# Patient Record
Sex: Female | Born: 1935 | Race: White | Hispanic: No | Smoking: Never smoker
Health system: Southern US, Community
[De-identification: ages and names within clinical notes are randomized; demographics above are authoritative.]

## PROBLEM LIST (undated history)

## (undated) DIAGNOSIS — I10 Essential (primary) hypertension: Secondary | ICD-10-CM

## (undated) DIAGNOSIS — G459 Transient cerebral ischemic attack, unspecified: Secondary | ICD-10-CM

## (undated) DIAGNOSIS — I272 Pulmonary hypertension, unspecified: Secondary | ICD-10-CM

## (undated) DIAGNOSIS — F32A Depression, unspecified: Secondary | ICD-10-CM

## (undated) DIAGNOSIS — G9341 Metabolic encephalopathy: Secondary | ICD-10-CM

## (undated) DIAGNOSIS — F0390 Unspecified dementia without behavioral disturbance: Secondary | ICD-10-CM

## (undated) DIAGNOSIS — K219 Gastro-esophageal reflux disease without esophagitis: Secondary | ICD-10-CM

## (undated) DIAGNOSIS — F329 Major depressive disorder, single episode, unspecified: Secondary | ICD-10-CM

## (undated) DIAGNOSIS — D649 Anemia, unspecified: Secondary | ICD-10-CM

## (undated) HISTORY — DX: Anemia, unspecified: D64.9

## (undated) HISTORY — DX: Transient cerebral ischemic attack, unspecified: G45.9

## (undated) HISTORY — DX: Pulmonary hypertension, unspecified: I27.20

## (undated) HISTORY — PX: ABDOMINAL HYSTERECTOMY: SHX81

## (undated) HISTORY — DX: Gastro-esophageal reflux disease without esophagitis: K21.9

## (undated) HISTORY — DX: Depression, unspecified: F32.A

## (undated) HISTORY — DX: Essential (primary) hypertension: I10

## (undated) HISTORY — DX: Major depressive disorder, single episode, unspecified: F32.9

---

## 1993-10-01 HISTORY — PX: OTHER SURGICAL HISTORY: SHX169

## 1997-10-01 HISTORY — PX: FRACTURE SURGERY: SHX138

## 2005-01-08 ENCOUNTER — Ambulatory Visit: Payer: Self-pay | Admitting: General Surgery

## 2006-01-27 ENCOUNTER — Emergency Department: Payer: Self-pay | Admitting: Emergency Medicine

## 2006-02-07 ENCOUNTER — Emergency Department: Payer: Self-pay | Admitting: Emergency Medicine

## 2008-04-18 ENCOUNTER — Inpatient Hospital Stay: Payer: Self-pay | Admitting: *Deleted

## 2008-04-19 ENCOUNTER — Other Ambulatory Visit: Payer: Self-pay

## 2008-04-27 ENCOUNTER — Ambulatory Visit: Payer: Self-pay | Admitting: Internal Medicine

## 2008-05-26 DIAGNOSIS — M810 Age-related osteoporosis without current pathological fracture: Secondary | ICD-10-CM | POA: Insufficient documentation

## 2008-05-26 DIAGNOSIS — Z8679 Personal history of other diseases of the circulatory system: Secondary | ICD-10-CM | POA: Insufficient documentation

## 2008-05-26 DIAGNOSIS — D649 Anemia, unspecified: Secondary | ICD-10-CM

## 2008-05-26 DIAGNOSIS — I10 Essential (primary) hypertension: Secondary | ICD-10-CM | POA: Insufficient documentation

## 2008-05-26 DIAGNOSIS — K219 Gastro-esophageal reflux disease without esophagitis: Secondary | ICD-10-CM

## 2008-05-26 DIAGNOSIS — R011 Cardiac murmur, unspecified: Secondary | ICD-10-CM

## 2008-06-01 HISTORY — PX: CHOLECYSTECTOMY: SHX55

## 2008-06-24 ENCOUNTER — Encounter: Payer: Self-pay | Admitting: Internal Medicine

## 2008-06-24 ENCOUNTER — Ambulatory Visit: Payer: Self-pay | Admitting: Internal Medicine

## 2008-06-24 DIAGNOSIS — R32 Unspecified urinary incontinence: Secondary | ICD-10-CM

## 2008-07-29 ENCOUNTER — Encounter: Payer: Self-pay | Admitting: Internal Medicine

## 2008-07-29 ENCOUNTER — Ambulatory Visit: Payer: Self-pay | Admitting: Internal Medicine

## 2008-07-29 DIAGNOSIS — R351 Nocturia: Secondary | ICD-10-CM | POA: Insufficient documentation

## 2008-09-15 ENCOUNTER — Telehealth: Payer: Self-pay | Admitting: Internal Medicine

## 2008-09-16 ENCOUNTER — Encounter: Payer: Self-pay | Admitting: Internal Medicine

## 2008-10-22 ENCOUNTER — Encounter: Payer: Self-pay | Admitting: Internal Medicine

## 2008-10-22 ENCOUNTER — Ambulatory Visit: Payer: Self-pay | Admitting: Internal Medicine

## 2008-12-16 ENCOUNTER — Encounter: Payer: Self-pay | Admitting: Internal Medicine

## 2008-12-16 ENCOUNTER — Ambulatory Visit: Payer: Self-pay | Admitting: Internal Medicine

## 2009-01-13 ENCOUNTER — Ambulatory Visit: Payer: Self-pay | Admitting: Internal Medicine

## 2009-01-13 ENCOUNTER — Encounter: Payer: Self-pay | Admitting: Internal Medicine

## 2009-03-17 ENCOUNTER — Encounter: Payer: Self-pay | Admitting: Internal Medicine

## 2009-03-17 ENCOUNTER — Ambulatory Visit: Payer: Self-pay | Admitting: Internal Medicine

## 2009-05-24 ENCOUNTER — Encounter: Payer: Self-pay | Admitting: Internal Medicine

## 2009-06-09 ENCOUNTER — Encounter: Payer: Self-pay | Admitting: Internal Medicine

## 2009-06-09 ENCOUNTER — Ambulatory Visit: Payer: Self-pay | Admitting: Internal Medicine

## 2009-06-09 DIAGNOSIS — G459 Transient cerebral ischemic attack, unspecified: Secondary | ICD-10-CM | POA: Insufficient documentation

## 2009-09-06 ENCOUNTER — Ambulatory Visit: Payer: Self-pay | Admitting: Internal Medicine

## 2009-09-06 ENCOUNTER — Encounter: Payer: Self-pay | Admitting: Internal Medicine

## 2009-10-18 ENCOUNTER — Telehealth (INDEPENDENT_AMBULATORY_CARE_PROVIDER_SITE_OTHER): Payer: Self-pay | Admitting: *Deleted

## 2009-10-19 ENCOUNTER — Encounter: Payer: Self-pay | Admitting: Internal Medicine

## 2009-11-22 ENCOUNTER — Encounter: Payer: Self-pay | Admitting: Internal Medicine

## 2010-01-19 ENCOUNTER — Ambulatory Visit: Payer: Self-pay | Admitting: Internal Medicine

## 2010-01-19 ENCOUNTER — Encounter: Payer: Self-pay | Admitting: Internal Medicine

## 2010-01-19 DIAGNOSIS — I079 Rheumatic tricuspid valve disease, unspecified: Secondary | ICD-10-CM | POA: Insufficient documentation

## 2010-04-27 ENCOUNTER — Encounter: Payer: Self-pay | Admitting: Internal Medicine

## 2010-04-27 ENCOUNTER — Ambulatory Visit: Payer: Self-pay | Admitting: Internal Medicine

## 2010-04-28 ENCOUNTER — Encounter: Payer: Self-pay | Admitting: Internal Medicine

## 2010-05-22 ENCOUNTER — Encounter: Payer: Self-pay | Admitting: Internal Medicine

## 2010-06-22 ENCOUNTER — Encounter: Payer: Self-pay | Admitting: Internal Medicine

## 2010-06-22 ENCOUNTER — Ambulatory Visit: Payer: Self-pay | Admitting: Internal Medicine

## 2010-06-22 DIAGNOSIS — F329 Major depressive disorder, single episode, unspecified: Secondary | ICD-10-CM

## 2010-08-01 ENCOUNTER — Encounter: Payer: Self-pay | Admitting: Internal Medicine

## 2010-09-14 ENCOUNTER — Encounter: Payer: Self-pay | Admitting: Internal Medicine

## 2010-10-31 NOTE — Assessment & Plan Note (Signed)
Summary: [facility]   Vital Signs:  Patient profile:   77 year old female Weight:      112 pounds Temp:     97.3 degrees F Pulse rate:   60 / minute Resp:     18 per minute BP sitting:   120 / 66 CC: Assisted living follow up   CC:  Assisted living follow up.  History of Present Illness: doing reasonably well Reviewed status with [name] , RN---no new concerns  Recent check up with [facility] cardiology No changes needed NO chest pain No SOB Notes "heart trouble" since her esophagus surgery  No dysphagia Heartburn not an active issue now  Slight cough of late Not sick with fever though Dry cough  Troubled by urinary frequency--goes every 2 hours intermittent incontinence EVery 2 hour nocturia as well  Does have feelings of being down at times Misses independence--not driving, stuck in 1 place Able to move on without major issues though  Allergies: 1)  ! * Carafate 2)  ! * Tramadol  Past History:  Past medical, surgical, family and social histories (including risk factors) reviewed, and no changes noted (except as noted below).  Past Medical History: GERD Hypertension Osteoporosis Transient ischemic attack, hx of Anemia-NOS Urinary incontinence Pulmonary HTN/TR  Past Surgical History: Reviewed history from 05/26/2008 and no changes required. Cholecystectomy- (06/2008) Hysterectomy- has one ovary Perforated esophagus during dilation (1995) Hip fracture (1999)  Family History: Reviewed history from 05/26/2008 and no changes required. Father: died in his 65's-  CAD Mother: died age 47- brain tumor Siblings: only child  Social History: Reviewed history from 06/24/2008 and no changes required. 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  Occ when husband was living  Review of Systems       sleeps reasonably well---occ takes 1 aleve----keeps her from "dreams" No sig  arthritis pain Still gets injections in right eye for macular degeneration appetite is fine weight is down 4# though   Physical Exam  General:  alert and healthy-appearing.   Neck:  supple, no masses, no thyromegaly, no carotid bruits, and no cervical lymphadenopathy.   Lungs:  normal respiratory effort and normal breath sounds.   Heart:  normal rate, regular rhythm, and no gallop.   soft systolic murmur along LLSB Abdomen:  soft and non-tender.   Msk:  no joint tenderness and no joint swelling.   Extremities:  no edema or calf tenderness Skin:  no suspicious lesions and no ulcerations.   Psych:  normally interactive, good eye contact, not anxious appearing, and not depressed appearing.     Impression & Recommendations:  Problem # 1:  HYPERTENSION (ICD-401.9) Assessment Unchanged good control on current meds  Her updated medication list for this problem includes:    Altace 5 Mg Tabs (Ramipril) .Marland Kitchen... Take one by mouth daily    Atenolol 50 Mg Tabs (Atenolol) .Marland Kitchen... Take one by mouth daily    Norvasc 10 Mg Tabs (Amlodipine besylate) .Marland Kitchen... Take one by mouth daily  BP today: 120/66 Prior BP: 118/62 (09/06/2009)  Problem # 2:  GERD (ICD-530.81) Assessment: Unchanged no recent dysphagia heartburn controlled needs PPI forever  Her updated medication list for this problem includes:    Omeprazole 20 Mg Tbec (Omeprazole) .Marland Kitchen... Take one by mouth daily  Problem # 3:  OSTEOPOROSIS (ICD-733.00) Assessment: Unchanged no falls  ambulates with rollator walker on vitamin D  Her updated medication list for this problem includes:  Vitamin D 98119 Unit Caps (Ergocalciferol) .Marland Kitchen... 1 monthly  Problem # 4:  URINARY INCONTINENCE (ICD-788.30) Assessment: Unchanged deals with the inconvenience Neither of [place] excited about trying meds  Problem # 5:  TRICUSPID REGURGITATION (ICD-397.0) Assessment: Unchanged stable status follows at [facility]  Her updated medication list for this problem  includes:    Atenolol 50 Mg Tabs (Atenolol) .Marland Kitchen... Take one by mouth daily    Aspirin Ec 81 Mg Tbec (Aspirin) .Marland Kitchen... 1 daily  Complete Medication List: 1)  Altace 5 Mg Tabs (Ramipril) .... Take one by mouth daily 2)  Atenolol 50 Mg Tabs (Atenolol) .... Take one by mouth daily 3)  Norvasc 10 Mg Tabs (Amlodipine besylate) .... Take one by mouth daily 4)  Omeprazole 20 Mg Tbec (Omeprazole) .... Take one by mouth daily 5)  Xalatan 0.005 % Soln (Latanoprost) .... Use one drop each eye at bedtime 6)  Vitamin D 14782 Unit Caps (Ergocalciferol) .Marland Kitchen.. 1 monthly 7)  Ventolin Hfa 108 (90 Base) Mcg/act Aers (Albuterol sulfate) .... 2 puffs three times a day as needed for wheezing. use with spacer 8)  Aspirin Ec 81 Mg Tbec (Aspirin) .Marland Kitchen.. 1 daily  Patient Instructions: 1)  Will plan follow up in 2-3 months

## 2010-10-31 NOTE — Assessment & Plan Note (Signed)
Summary: [facility]   Vital Signs:  Patient profile:   77 year old female Weight:      109 pounds Temp:     97.4 degrees F Pulse rate:   64 / minute Resp:     18 per minute BP sitting:   124 / 64  History of Present Illness: Had still been taking desipramine  despite my stopping it discussed this she doesn't note any help anyway--will have her stop  Family , or friends, are concerned about her mood Staff notes occ depressed mood She endorses this but states "I am just getting old. When you have to stay in the same building all the time" Frustrations with laundry issues Does have some anhedonia  Has to eat slowly but eats okay sleeps okay but not great Has some thought about dying at times--thinks about her financial issues-- "to settle up everything"  No chest pain No SOB Mild edema--generally controlled with support hose    Allergies: 1)  ! * Carafate 2)  ! * Tramadol  Past History:  Past medical, surgical, family and social histories (including risk factors) reviewed for relevance to current acute and chronic problems.  Past Medical History: GERD Hypertension Osteoporosis Transient ischemic attack, hx of Anemia-NOS Urinary incontinence Pulmonary HTN/TR Depression  Past Surgical History: Reviewed history from 05/26/2008 and no changes required. Cholecystectomy- (06/2008) Hysterectomy- has one ovary Perforated esophagus during dilation (1995) Hip fracture (1999)  Family History: Reviewed history from 05/26/2008 and no changes required. Father: died in his 50's-  CAD Mother: died age 28- brain tumor Siblings: only child  Social History: Reviewed history from 06/24/2008 and no changes required. 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  Occ when husband was living  Review of Systems       Weight is down 3# since last visit bowels are okay--occ needs med for   constipation No urinary problems during the day--does have frequency but can manage--just the problems at night  Physical Exam  General:  alert.  NAD Neck:  supple, no masses, no thyromegaly, no carotid bruits, and no cervical lymphadenopathy.   Lungs:  normal respiratory effort, no intercostal retractions, no accessory muscle use, and normal breath sounds.   Heart:  normal rate, regular rhythm, and no gallop.   Occ skip beats soft systolic murmur Abdomen:  soft and non-tender.   Msk:  no joint tenderness and no joint swelling.   Extremities:  trace edema Skin:  multiple benign keratoses Psych:  normally interactive, good eye contact, not anxious appearing, dysphoric affect, and subdued.     Impression & Recommendations:  Problem # 1:  DEPRESSION (ICD-311) Assessment New  clearly seems to have depressed mood and decreased interest in activities, etc seems to have mild sleep and appetite issues as well will try low dose mirtazapine  Her updated medication list for this problem includes:    Mirtazapine 7.5 Mg Tabs (Mirtazapine) .Marland Kitchen... 1 tab by mouth at bedtime  Problem # 2:  HYPERTENSION (ICD-401.9) Assessment: Unchanged BP well controlled will  cut back on beta blocker in case that could be affecting mood  Her updated medication list for this problem includes:    Altace 5 Mg Tabs (Ramipril) .Marland Kitchen... Take one by mouth daily    Atenolol 50 Mg Tabs (Atenolol) .Marland Kitchen... Take 1/2 tab by mouth daily    Norvasc 10 Mg Tabs (Amlodipine besylate) .Marland Kitchen... Take one by mouth daily  BP today: 124/64 Prior  BP: 120/68 (04/27/2010)  Problem # 3:  GERD (ICD-530.81) Assessment: Unchanged okay on the medicine  Her updated medication list for this problem includes:    Omeprazole 20 Mg Tbec (Omeprazole) .Marland Kitchen... Take one by mouth daily  Problem # 4:  URINARY INCONTINENCE (ICD-788.30) Assessment: Unchanged not helped by oxybutynin or desipramine perhaps she will do better if she sleeps sounder on the  mirtazapine  Complete Medication List: 1)  Mirtazapine 7.5 Mg Tabs (Mirtazapine) .Marland Kitchen.. 1 tab by mouth at bedtime 2)  Altace 5 Mg Tabs (Ramipril) .... Take one by mouth daily 3)  Atenolol 50 Mg Tabs (Atenolol) .... Take 1/2 tab by mouth daily 4)  Norvasc 10 Mg Tabs (Amlodipine besylate) .... Take one by mouth daily 5)  Omeprazole 20 Mg Tbec (Omeprazole) .... Take one by mouth daily 6)  Xalatan 0.005 % Soln (Latanoprost) .... Use one drop each eye at bedtime 7)  Vitamin D 16109 Unit Caps (Ergocalciferol) .Marland Kitchen.. 1 monthly 8)  Ventolin Hfa 108 (90 Base) Mcg/act Aers (Albuterol sulfate) .... 2 puffs three times a day as needed for wheezing. use with spacer 9)  Aspirin Ec 81 Mg Tbec (Aspirin) .Marland Kitchen.. 1 daily  Patient Instructions: 1)  Will plan follow up in 2-3 months

## 2010-10-31 NOTE — Progress Notes (Signed)
Summary: Prior Authorization for Omeprazole  Phone Note From Pharmacy Call back at [phone]   Caller: [facility] Call For: Dr. Alphonsus Sias  Summary of Call: Received faxed form from pharmacy.  Spoke with Marcelino Duster at [facility] and the patient's plan covers a 90 day supply within a rolling 180 day period.  If the patient needs more, prior Berkley Harvey is required.  She is faxing over the paper work.  Case number [id number].  Form in your IN box Initial call taken by: Linde Gillis CMA Duncan Dull),  October 18, 2009 2:27 PM  Follow-up for Phone Call        form done Follow-up by: Cindee Salt MD,  October 19, 2009 8:14 AM  Additional Follow-up for Phone Call Additional follow up Details #1::        Prior Authorization Form faxed to [facility]. Additional Follow-up by: Beau Fanny,  October 19, 2009 9:15 AM     Appended Document: Prior Authorization for Omeprazole Prior Authorization approved for Omeprazole.  Request approved from 09/28/2009 until 10/19/2010.

## 2010-10-31 NOTE — Medication Information (Signed)
Summary: Prior Authorization for Omeprazole/[facility]  Prior Authorization for Omeprazole/[facility]   Imported By: Lanelle Bal 11/01/2009 10:52:13  _____________________________________________________________________  External Attachment:    Type:   Image     Comment:   External Document

## 2010-10-31 NOTE — Miscellaneous (Signed)
Summary: Notice of Patient Fall/[facility]  Notice of Patient Fall/[facility]   Imported By: Lanelle Bal 05/29/2010 08:28:06  _____________________________________________________________________  External Attachment:    Type:   Image     Comment:   External Document

## 2010-10-31 NOTE — Miscellaneous (Signed)
Summary: Vitamin D Order/[facility]  Vitamin D Order/[facility]   Imported By: Lanelle Bal 05/03/2010 10:03:13  _____________________________________________________________________  External Attachment:    Type:   Image     Comment:   External Document

## 2010-10-31 NOTE — Medication Information (Signed)
Summary: Approval for Additional Quantity Omeprazole/[facility]  Approval for Additional Quantity Omeprazole/[facility]   Imported By: Lanelle Bal 10/27/2009 12:44:27  _____________________________________________________________________  External Attachment:    Type:   Image     Comment:   External Document

## 2010-10-31 NOTE — Letter (Signed)
Summary: [facility] -Cardiovascular Note  [facility] -[facility]ovascular [facility]   Imported By: Maryln Gottron 08/28/2010 12:23:12  _____________________________________________________________________  External Attachment:    Type:   Image     Comment:   External Document  Appended Document: [facility] -Cardiovascular Note needs lasix if her weight goes up significantly

## 2010-10-31 NOTE — Assessment & Plan Note (Signed)
Summary: [facility]   Vital Signs:  Patient profile:   77 year old female Weight:      112 pounds Temp:     97.7 degrees F Pulse rate:   60 / minute Resp:     14 per minute BP sitting:   120 / 68  History of Present Illness: Status reviewed with CArla RN Doing fairly well  Still up every 2 hours day and night to void Usually does get back to sleep okay Leaks some but doing okay with the pads failed oxybutynin Really feels this is a quality of life issue  BP has been okay No headaches No chest pain  No SOB  Still has to be careful with swallowing or she gets choked No sig heartburn Bowels okay with prunes   Allergies: 1)  ! * Carafate 2)  ! * Tramadol  Past History:  Past medical, surgical, family and social histories (including risk factors) reviewed for relevance to current acute and chronic problems.  Past Medical History: Reviewed history from 01/19/2010 and no changes required. GERD Hypertension Osteoporosis Transient ischemic attack, hx of Anemia-NOS Urinary incontinence Pulmonary HTN/TR  Past Surgical History: Reviewed history from 05/26/2008 and no changes required. Cholecystectomy- (06/2008) Hysterectomy- has one ovary Perforated esophagus during dilation (1995) Hip fracture (1999)  Family History: Reviewed history from 05/26/2008 and no changes required. Father: died in his 36's-  CAD Mother: died age 52- brain tumor Siblings: only child  Social History: Reviewed history from 06/24/2008 and no changes required. 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  Occ when husband was living  Review of Systems       tooth problem--going to dentist soon Sleeps okay except for the nocturia appetite is fine Weight is stable  Physical Exam  General:  alert and normal appearance.   Neck:  supple, no masses, no carotid bruits, and no cervical lymphadenopathy.     Lungs:  normal respiratory effort, no intercostal retractions, no accessory muscle use, and normal breath sounds.   Heart:  normal rate, no murmur, and no gallop.   Soft systolic miurmur along left sternal border Abdomen:  soft and non-tender.   Msk:  no joint tenderness and no joint swelling.   Extremities:  no sig edema with support hose on  Skin:  no suspicious lesions and no ulcerations.   Multiple seb keratoses Psych:  normally interactive, good eye contact, not anxious appearing, and not depressed appearing.     Impression & Recommendations:  Problem # 1:  URINARY INCONTINENCE (ICD-788.30) Assessment Deteriorated worsened and bothering her failed oxybutynin will try low dose desipramine---if ineffective or side effects, will not try other meds  Problem # 2:  HYPERTENSION (ICD-401.9) Assessment: Unchanged good control no changes needed  Her updated medication list for this problem includes:    Altace 5 Mg Tabs (Ramipril) .Marland Kitchen... Take one by mouth daily    Atenolol 50 Mg Tabs (Atenolol) .Marland Kitchen... Take one by mouth daily    Norvasc 10 Mg Tabs (Amlodipine besylate) .Marland Kitchen... Take one by mouth daily  BP today: 120/68 Prior BP: 120/66 (01/19/2010)  Problem # 3:  GERD (ICD-530.81) Assessment: Unchanged with chronic mild dysphagia after esophageal surgery needs PPI indefinitely  Her updated medication list for this problem includes:    Omeprazole 20 Mg Tbec (Omeprazole) .Marland Kitchen... Take one by mouth daily  Problem # 4:  OSTEOPOROSIS (ICD-733.00) Assessment: Unchanged walks stably with walker discussed safety with the desipramine  Her updated medication list for this problem includes:    Vitamin D 16109 Unit Caps (Ergocalciferol) .Marland Kitchen... 1 monthly  Complete Medication List: 1)  Altace 5 Mg Tabs (Ramipril) .... Take one by mouth daily 2)  Atenolol 50 Mg Tabs (Atenolol) .... Take one by mouth daily 3)  Norvasc 10 Mg Tabs (Amlodipine besylate) .... Take one by mouth daily 4)  Omeprazole  20 Mg Tbec (Omeprazole) .... Take one by mouth daily 5)  Xalatan 0.005 % Soln (Latanoprost) .... Use one drop each eye at bedtime 6)  Vitamin D 60454 Unit Caps (Ergocalciferol) .Marland Kitchen.. 1 monthly 7)  Ventolin Hfa 108 (90 Base) Mcg/act Aers (Albuterol sulfate) .... 2 puffs three times a day as needed for wheezing. use with spacer 8)  Aspirin Ec 81 Mg Tbec (Aspirin) .Marland Kitchen.. 1 daily  Patient Instructions: 1)  Will plan follow up in 2-3 months

## 2010-10-31 NOTE — Letter (Signed)
Summary: [facility]  [facility]   Imported By: Lanelle Bal 01/10/2010 13:52:18  _____________________________________________________________________  External Attachment:    Type:   Image     Comment:   External Document  Appended Document: [facility] TR and pulmonary hypertension Doing well No changes 1 year follow up

## 2010-11-02 NOTE — Assessment & Plan Note (Signed)
Summary: [facility]   Vital Signs:  Patient profile:   77 year old female Weight:      110 pounds Temp:     97.3 degrees F Pulse rate:   64 / minute Resp:     16 per minute BP sitting:   110 / 60  History of Present Illness: Generally doing well No new concerns from [place] RN  Did fall  ~5Am several days ago. Going to bathroom and didn't use walker Fell on floor but fortunately no sig injury No arthritis pain in general  Had brief spell of left flank pain at night---goes away quickly No SOB tries to walk in building some but generally sedentary--can walk all the way to health care building for hair appointment  Concerned that there is not enough help in dining room room lately Otherwise satisfied with it here generally happy  still with incontrinence Uses attends  No stomach trouble heartburn quiet occ bowel problems --uses prunes Uses senna-S as needed   Allergies: 1)  ! * Carafate 2)  ! * Tramadol  Past History:  Past medical, surgical, family and social histories (including risk factors) reviewed for relevance to current acute and chronic problems.  Past Medical History: Reviewed history from 06/22/2010 and no changes required. GERD Hypertension Osteoporosis Transient ischemic attack, hx of Anemia-NOS Urinary incontinence Pulmonary HTN/TR Depression  Past Surgical History: Reviewed history from 05/26/2008 and no changes required. Cholecystectomy- (06/2008) Hysterectomy- has one ovary Perforated esophagus during dilation (1995) Hip fracture (1999)  Family History: Reviewed history from 05/26/2008 and no changes required. Father: died in his 50's-  CAD Mother: died age 49- brain tumor Siblings: only child  Social History: Reviewed history from 06/24/2008 and no changes required. 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  Occ when husband was living  Review of  Systems       appetite is fine weight stable generally sleeps okay  Physical Exam  General:  alert and normal appearance.   Neck:  supple, no masses, no thyromegaly, and no cervical lymphadenopathy.   Lungs:  normal respiratory effort, no intercostal retractions, no accessory muscle use, and normal breath sounds.   Heart:  normal rate, regular rhythm, and no gallop.   Occ skip beats soft systolic murmur Abdomen:  soft and non-tender.   Msk:  no joint tenderness and no joint swelling.   Extremities:  trace pedal edema L>R Neurologic:  alert & oriented X3 and strength normal in all extremities.  Walks stably with rollator Psych:  normally interactive, good eye contact, not anxious appearing, and not depressed appearing.     Impression & Recommendations:  Problem # 1:  HYPERTENSION (ICD-401.9) Assessment Unchanged good control no orthostasis but if any dizziness, would cut back on meds  Her updated medication list for this problem includes:    Altace 5 Mg Tabs (Ramipril) .Marland Kitchen... Take one by mouth daily    Atenolol 50 Mg Tabs (Atenolol) .Marland Kitchen... Take 1/2 tab by mouth daily    Norvasc 10 Mg Tabs (Amlodipine besylate) .Marland Kitchen... Take one by mouth daily  BP today: 110/60 Prior BP: 124/64 (06/22/2010)  Problem # 2:  GERD (ICD-530.81) Assessment: Unchanged good control on the med  Her updated medication list for this problem includes:    Omeprazole 20 Mg Tbec (Omeprazole) .Marland Kitchen... Take one by mouth daily  Problem # 3:  DEPRESSION (ICD-311) Assessment: Improved mood has been good Med seems to help sleep also so  will not try to wean  Her updated medication list for this problem includes:    Mirtazapine 7.5 Mg Tabs (Mirtazapine) .Marland Kitchen... 1 tab by mouth at bedtime  Problem # 4:  URINARY INCONTINENCE (ICD-788.30) Assessment: Unchanged just uses the attends satisfied with this in general  Complete Medication List: 1)  Mirtazapine 7.5 Mg Tabs (Mirtazapine) .Marland Kitchen.. 1 tab by mouth at bedtime 2)   Altace 5 Mg Tabs (Ramipril) .... Take one by mouth daily 3)  Atenolol 50 Mg Tabs (Atenolol) .... Take 1/2 tab by mouth daily 4)  Norvasc 10 Mg Tabs (Amlodipine besylate) .... Take one by mouth daily 5)  Omeprazole 20 Mg Tbec (Omeprazole) .... Take one by mouth daily 6)  Xalatan 0.005 % Soln (Latanoprost) .... Use one drop each eye at bedtime 7)  Vitamin D 96295 Unit Caps (Ergocalciferol) .Marland Kitchen.. 1 monthly 8)  Ventolin Hfa 108 (90 Base) Mcg/act Aers (Albuterol sulfate) .... 2 puffs three times a day as needed for wheezing. use with spacer 9)  Aspirin Ec 81 Mg Tbec (Aspirin) .Marland Kitchen.. 1 daily  Patient Instructions: 1)  Will plan follow up in 2-3 months

## 2010-11-07 ENCOUNTER — Telehealth: Payer: Self-pay | Admitting: Internal Medicine

## 2010-11-08 ENCOUNTER — Encounter: Payer: Self-pay | Admitting: Internal Medicine

## 2010-11-16 NOTE — Progress Notes (Signed)
Summary: prior Berkley Harvey is needed for omeprazole  Phone Note From Pharmacy   Caller: [facility]/ [facility] Summary of Call: Prior Berkley Harvey is needed for omeprazole, form is on your desk. Initial call taken by: Lowella Petties CMA, AAMA,  November 07, 2010 5:06 PM  Follow-up for Phone Call        form done Follow-up by: Cindee Salt MD,  November 08, 2010 8:56 AM  Additional Follow-up for Phone Call Additional follow up Details #1::        form faxed and given to [facility] Additional Follow-up by: Mervin Hack CMA Duncan Dull),  November 08, 2010 11:15 AM     Appended Document: prior Berkley Harvey is needed for omeprazole Prior auth given for omeprazole, pharmacy notified, approval letter placed on doctor's desk for signature and scanning.

## 2010-11-16 NOTE — Progress Notes (Signed)
Summary: refill request for vitamin d  Phone Note Refill Request Message from:  Fax from Pharmacy  Refills Requested: Medication #1:  VITAMIN D 16109 UNIT CAPS 1 monthly   Last Refilled: 09/11/2010 Faxed request from [facility] is on your desk.  Is pt to continue this?  Initial call taken by: Lowella Petties CMA, AAMA,  November 07, 2010 3:04 PM  Follow-up for Phone Call        yes  Rx sent Follow-up by: Cindee Salt MD,  November 08, 2010 8:54 AM    New/Updated Medications: VITAMIN D 60454 UNIT CAPS (ERGOCALCIFEROL) 1 monthly Prescriptions: VITAMIN D 09811 UNIT CAPS (ERGOCALCIFEROL) 1 monthly  #1 x 12   Entered and Authorized by:   Cindee Salt MD   Signed by:   Cindee Salt MD on 11/08/2010   Method used:   Electronically to        [facility]* (retail)       [address] [place], [place]  [address]       Ph: [phone]       Fax: [phone]   RxID:   [id number]

## 2010-11-28 NOTE — Medication Information (Signed)
Summary: Prior Authorization & Approval for Additional Quantity Omeprazol  Prior Authorization & Approval for Additional Quantity Omeprazole/[facility]   Imported By: Lanelle Bal 11/20/2010 15:00:57  _____________________________________________________________________  External Attachment:    Type:   Image     Comment:   External Document

## 2010-12-04 ENCOUNTER — Encounter: Payer: Self-pay | Admitting: Internal Medicine

## 2011-01-04 ENCOUNTER — Non-Acute Institutional Stay: Payer: Medicare Other | Admitting: Internal Medicine

## 2011-01-04 ENCOUNTER — Encounter: Payer: Self-pay | Admitting: Internal Medicine

## 2011-01-04 VITALS — BP 110/62 | HR 58 | Temp 98.0°F | Resp 16 | Wt 102.5 lb

## 2011-01-04 DIAGNOSIS — I10 Essential (primary) hypertension: Secondary | ICD-10-CM

## 2011-01-04 DIAGNOSIS — R634 Abnormal weight loss: Secondary | ICD-10-CM

## 2011-01-04 DIAGNOSIS — F329 Major depressive disorder, single episode, unspecified: Secondary | ICD-10-CM

## 2011-01-04 DIAGNOSIS — K5909 Other constipation: Secondary | ICD-10-CM

## 2011-01-04 DIAGNOSIS — K219 Gastro-esophageal reflux disease without esophagitis: Secondary | ICD-10-CM

## 2011-01-04 DIAGNOSIS — R32 Unspecified urinary incontinence: Secondary | ICD-10-CM

## 2011-01-04 NOTE — Progress Notes (Signed)
  Subjective:    Patient ID: Lindsay Branch, female    DOB: Oct 12, 1912, 77 y.o.   MRN: [id number]  HPI Former neighbor who handles her affairs is here--Art Maisie Fus Has been getting dental work on new bridge No pain but not eating well Getting extra protein shake from the [employer]  Has lost some weight though Discussed trying to eat snacks  Has been having trouble with her bowels Takes her own senna and has used prn MOM  Stomach okay Continues on the omeprazole  Still with some incontinence Satisfied with using a pad  No chest pain  No SOb No edema with the support hose  Past Medical History  Diagnosis Date  . GERD (gastroesophageal reflux disease)   . Hypertension   . Osteoporosis   . Anemia   . Depression   . TIA (transient ischemic attack)   . Urinary incontinence   . Pulmonary hypertension     Past Surgical History  Procedure Date  . Abdominal hysterectomy     1 ovary left  . Perforated esophagus during dilation 1995  . Fracture surgery 1999    Hip  . Cholecystectomy 06/2008    Family History  Problem Relation Age of Onset  . Cancer Mother     brain cancer  . Heart disease Father     History   Social History  . Marital Status: Widowed    Spouse Name: N/A    Number of Children: 0  . Years of Education: N/A   Occupational History  . school teacher     retired   Social History Main Topics  . Smoking status: Never Smoker   . Smokeless tobacco: Never Used  . Alcohol Use: No  . Drug Use:   . Sexually Active:    Other Topics Concern  . Not on file   Social History Narrative  . No narrative on file   Review of Systems Weight down about 7# Generally sleeps okay Mood has been okay     Objective:   Physical Exam  Constitutional: She appears well-developed and well-nourished. No distress.  HENT:       No oral lesions or inflammation  Neck: Normal range of motion. Neck supple. No thyromegaly present.  Cardiovascular: Normal rate,  regular rhythm and normal heart sounds.  Exam reveals no gallop.        Soft systolic murmur  Pulmonary/Chest: Effort normal and breath sounds normal. No respiratory distress. She has no wheezes. She has no rales.  Abdominal: Soft. There is no tenderness.  Musculoskeletal: She exhibits no tenderness.       Trace edema  Lymphadenopathy:    She has no cervical adenopathy.  Psychiatric: She has a normal mood and affect. Her behavior is normal. Judgment and thought content normal.          Assessment & Plan:

## 2011-04-05 ENCOUNTER — Non-Acute Institutional Stay: Payer: Medicare Other | Admitting: Internal Medicine

## 2011-04-05 VITALS — BP 110/60 | HR 62 | Resp 18 | Wt 100.0 lb

## 2011-04-05 DIAGNOSIS — F329 Major depressive disorder, single episode, unspecified: Secondary | ICD-10-CM

## 2011-04-05 DIAGNOSIS — I10 Essential (primary) hypertension: Secondary | ICD-10-CM

## 2011-04-05 DIAGNOSIS — R634 Abnormal weight loss: Secondary | ICD-10-CM

## 2011-04-05 DIAGNOSIS — F3289 Other specified depressive episodes: Secondary | ICD-10-CM

## 2011-04-05 DIAGNOSIS — K219 Gastro-esophageal reflux disease without esophagitis: Secondary | ICD-10-CM

## 2011-04-05 DIAGNOSIS — K5909 Other constipation: Secondary | ICD-10-CM

## 2011-04-05 NOTE — Assessment & Plan Note (Signed)
BP Readings from Last 3 Encounters:  04/05/11 110/60  01/04/11 110/62  09/14/10 110/60   Running persistently low Will cut back on the amlodipine---this might help her mild edema as well

## 2011-04-05 NOTE — Patient Instructions (Signed)
Will plan visit again in about 3 months

## 2011-04-05 NOTE — Progress Notes (Signed)
Subjective:    Patient ID: Lindsay Branch, female    DOB: 1913/08/16, 77 y.o.   MRN: [id number]  HPI Reviewed care with staff here They are concerned about continued weight loss--as  Is she Now has her bridge in--eating some better in past short time Trying to find food that is soft and she can chew Weight down 2# more in past 3 months Friends still give her shakes and she gets magic cup. Unfortunately, she is having the shake instead of breakfast  Has been down-- worried about her weight loss Tries to walk regularly Does participate with some activites--like birthday parties for residents, children's visits, etc. Not anhedonic  No chest pain No SOb No sig edema  Has been on miralax Bowels have been better  Stomach has been okay Continues on PPI  Current Outpatient Prescriptions on File Prior to Visit  Medication Sig Dispense Refill  . albuterol (VENTOLIN HFA) 108 (90 BASE) MCG/ACT inhaler Inhale 2 puffs into the lungs 3 (three) times daily as needed.        Marland Kitchen amLODipine (NORVASC) 10 MG tablet Take 10 mg by mouth daily.        Marland Kitchen aspirin 81 MG EC tablet Take 81 mg by mouth daily.        Marland Kitchen atenolol (TENORMIN) 25 MG tablet Take 25 mg by mouth daily.        . ergocalciferol (VITAMIN D2) 50000 UNITS capsule Take 50,000 Units by mouth once a week.        . latanoprost (XALATAN) 0.005 % ophthalmic solution Place 1 drop into both eyes at bedtime.        . mirtazapine (REMERON) 7.5 MG tablet Take 7.5 mg by mouth at bedtime.        Marland Kitchen omeprazole (PRILOSEC) 20 MG capsule Take 20 mg by mouth daily.        . ramipril (ALTACE) 5 MG tablet Take 5 mg by mouth daily.          Allergies  Allergen Reactions  . Sucralfate   . Tramadol     REACTION: hallucinations, paranoia    Past Medical History  Diagnosis Date  . GERD (gastroesophageal reflux disease)   . Hypertension   . Osteoporosis   . Anemia   . Depression   . TIA (transient ischemic attack)   . Urinary incontinence   .  Pulmonary hypertension     Past Surgical History  Procedure Date  . Abdominal hysterectomy     1 ovary left  . Perforated esophagus during dilation 1995  . Fracture surgery 1999    Hip  . Cholecystectomy 06/2008    Family History  Problem Relation Age of Onset  . Cancer Mother     brain cancer  . Heart disease Father     History   Social History  . Marital Status: Widowed    Spouse Name: N/A    Number of Children: 0  . Years of Education: N/A   Occupational History  . school teacher     retired   Social History Main Topics  . Smoking status: Never Smoker   . Smokeless tobacco: Never Used  . Alcohol Use: No  . Drug Use:   . Sexually Active:    Other Topics Concern  . Not on file   Social History Narrative  . No narrative on file   Review of Systems No sig joint pains Sleeps okay Has lost some sight in her right eye--uses magnifier  Objective:   Physical Exam  Constitutional: She appears well-developed. No distress.  Neck: Normal range of motion. Neck supple. No thyromegaly present.  Cardiovascular: Normal rate, regular rhythm and normal heart sounds.  Exam reveals no gallop.   No murmur heard. Pulmonary/Chest: Effort normal and breath sounds normal. No respiratory distress. She has no wheezes. She has no rales.  Abdominal: Soft. There is no tenderness.  Musculoskeletal: Normal range of motion. She exhibits no tenderness.       Slight pedal edema Hose are on  Lymphadenopathy:    She has no cervical adenopathy.  Psychiatric: She has a normal mood and affect. Her behavior is normal. Judgment and thought content normal.          Assessment & Plan:

## 2011-04-05 NOTE — Assessment & Plan Note (Signed)
Controlled on prilosec Will continue

## 2011-04-05 NOTE — Assessment & Plan Note (Signed)
Better on miralax

## 2011-04-05 NOTE — Assessment & Plan Note (Signed)
Worried about her weight but no really depressed or anhedonic Will continue the mirtazapine

## 2011-04-05 NOTE — Assessment & Plan Note (Signed)
Continues despite the mirtazapine Has been drinking shake for breakfast---asked staff to bring in cereal, pastries, etc for her to eat for breakfast and have the shake inbetween meals

## 2011-05-14 ENCOUNTER — Telehealth: Payer: Self-pay | Admitting: *Deleted

## 2011-05-14 NOTE — Telephone Encounter (Signed)
Lindsay Branch is asking that a signed med list be faxed over to [facility].

## 2011-05-14 NOTE — Telephone Encounter (Signed)
Done  Please fax

## 2011-05-14 NOTE — Telephone Encounter (Signed)
Med list faxed

## 2011-06-21 ENCOUNTER — Non-Acute Institutional Stay: Payer: Medicare Other | Admitting: Internal Medicine

## 2011-06-21 ENCOUNTER — Encounter: Payer: Self-pay | Admitting: Internal Medicine

## 2011-06-21 VITALS — BP 100/60 | HR 62 | Temp 98.1°F | Resp 18 | Wt 99.0 lb

## 2011-06-21 DIAGNOSIS — F3289 Other specified depressive episodes: Secondary | ICD-10-CM

## 2011-06-21 DIAGNOSIS — R634 Abnormal weight loss: Secondary | ICD-10-CM

## 2011-06-21 DIAGNOSIS — I10 Essential (primary) hypertension: Secondary | ICD-10-CM

## 2011-06-21 DIAGNOSIS — F329 Major depressive disorder, single episode, unspecified: Secondary | ICD-10-CM

## 2011-06-21 DIAGNOSIS — K219 Gastro-esophageal reflux disease without esophagitis: Secondary | ICD-10-CM

## 2011-06-21 NOTE — Assessment & Plan Note (Signed)
BP Readings from Last 3 Encounters:  06/21/11 100/60  04/05/11 110/60  01/04/11 110/62   BP consistently low Will try off amlodipine Consider trying off atenolol next time if still low

## 2011-06-21 NOTE — Assessment & Plan Note (Signed)
Seems to have good symptom control on the omeprazole No change

## 2011-06-21 NOTE — Patient Instructions (Signed)
Will plan follow up in about 3 months 

## 2011-06-21 NOTE — Progress Notes (Signed)
Subjective:    Patient ID: Lindsay Branch, female    DOB: [birth year], 77 y.o.   MRN: [id number]  HPI Still not eating well Couldn't tolerate medpass due to diarrhea Is eating danish every morning now Weight down 2# more  Still worries about her weight No daily depressed mood--still gets spells but no persistent  No headaches No dizziness or fainting spells No chest pain  No sig SOB  No stomach trouble No heartburn or nausea Bowels generally okay with the miralax  Current Outpatient Prescriptions on File Prior to Visit  Medication Sig Dispense Refill  . albuterol (VENTOLIN HFA) 108 (90 BASE) MCG/ACT inhaler Inhale 2 puffs into the lungs 3 (three) times daily as needed.        Marland Kitchen aspirin 81 MG EC tablet Take 81 mg by mouth daily.        Marland Kitchen atenolol (TENORMIN) 25 MG tablet Take 25 mg by mouth daily.        . ergocalciferol (VITAMIN D2) 50000 UNITS capsule Take 50,000 Units by mouth once a week.        . latanoprost (XALATAN) 0.005 % ophthalmic solution Place 1 drop into both eyes at bedtime.        Marland Kitchen omeprazole (PRILOSEC) 20 MG capsule Take 20 mg by mouth daily.        . polyethylene glycol (MIRALAX / GLYCOLAX) packet Take 17 g by mouth daily.        . ramipril (ALTACE) 5 MG tablet Take 5 mg by mouth daily.          Allergies  Allergen Reactions  . Sucralfate   . Tramadol     REACTION: hallucinations, paranoia    Past Medical History  Diagnosis Date  . GERD (gastroesophageal reflux disease)   . Hypertension   . Osteoporosis   . Anemia   . Depression   . TIA (transient ischemic attack)   . Urinary incontinence   . Pulmonary hypertension     Past Surgical History  Procedure Date  . Abdominal hysterectomy     1 ovary left  . Perforated esophagus during dilation 1995  . Fracture surgery 1999    Hip  . Cholecystectomy 06/2008    Family History  Problem Relation Age of Onset  . Cancer Mother     brain cancer  . Heart disease Father     History   Social  History  . Marital Status: Widowed    Spouse Name: N/A    Number of Children: 0  . Years of Education: N/A   Occupational History  . school teacher     retired   Social History Main Topics  . Smoking status: Never Smoker   . Smokeless tobacco: Never Used  . Alcohol Use: No  . Drug Use:   . Sexually Active:    Other Topics Concern  . Not on file   Social History Narrative  . No narrative on file   Review of Systems Sleeps okay Some fatigue---but vague about this Voids okay--wears pad for protection. Usually only wet in AM     Objective:   Physical Exam  Constitutional: She appears well-developed. No distress.       Thin with slight wasting  Neck: Normal range of motion. Neck supple. No thyromegaly present.  Cardiovascular: Normal rate and regular rhythm.  Exam reveals no gallop.   Murmur heard.      Rare skip Gr2/6 systolic murmur along lower left sternal border and slightly to apex  Pulmonary/Chest: Effort normal and breath sounds normal. No respiratory distress. She has no wheezes. She has no rales.  Abdominal: Soft. There is no tenderness.  Musculoskeletal: Normal range of motion. She exhibits no edema and no tenderness.  Lymphadenopathy:    She has no cervical adenopathy.  Psychiatric: Her behavior is normal. Judgment and thought content normal.       Mood is neutral Mild constricted affect          Assessment & Plan:

## 2011-06-21 NOTE — Assessment & Plan Note (Signed)
Has lost a little more Still not hungry Will try increasing the mirtazapine to 15mg 

## 2011-06-21 NOTE — Assessment & Plan Note (Signed)
Worries about weight loss Some degree of anhedonia Will try the increased mirtazapine

## 2011-08-02 ENCOUNTER — Other Ambulatory Visit: Payer: Self-pay | Admitting: *Deleted

## 2011-08-02 MED ORDER — ERGOCALCIFEROL 1.25 MG (50000 UT) PO CAPS: 50000.0000 [IU] | ORAL_CAPSULE | ORAL | Status: AC

## 2011-08-02 NOTE — Telephone Encounter (Signed)
rx faxed to pharmacy

## 2011-09-11 ENCOUNTER — Encounter: Payer: Self-pay | Admitting: Internal Medicine

## 2011-09-27 ENCOUNTER — Encounter: Payer: Self-pay | Admitting: Internal Medicine

## 2011-09-27 ENCOUNTER — Non-Acute Institutional Stay: Payer: Medicare Other | Admitting: Internal Medicine

## 2011-09-27 VITALS — BP 148/80 | HR 68 | Temp 97.9°F | Resp 18 | Wt 100.0 lb

## 2011-09-27 DIAGNOSIS — F329 Major depressive disorder, single episode, unspecified: Secondary | ICD-10-CM

## 2011-09-27 DIAGNOSIS — R634 Abnormal weight loss: Secondary | ICD-10-CM

## 2011-09-27 DIAGNOSIS — I10 Essential (primary) hypertension: Secondary | ICD-10-CM

## 2011-09-27 DIAGNOSIS — M810 Age-related osteoporosis without current pathological fracture: Secondary | ICD-10-CM

## 2011-09-27 DIAGNOSIS — K219 Gastro-esophageal reflux disease without esophagitis: Secondary | ICD-10-CM

## 2011-09-27 NOTE — Assessment & Plan Note (Signed)
Some melancholy about her limitations Tries to stay involved No major depression

## 2011-09-27 NOTE — Assessment & Plan Note (Signed)
On calcium and vitamin D Discussed continuing her walking on a daily basis

## 2011-09-27 NOTE — Assessment & Plan Note (Signed)
Has not had any symptoms Will cut down on the omeprazole and then discontinue if does well. PPI increases fracture risk

## 2011-09-27 NOTE — Progress Notes (Signed)
Subjective:    Patient ID: Lindsay Branch, female    DOB: 1913/09/25, 77 y.o.   MRN: [id number]  HPI Reviewed status with [name] RN  Has had several falls One at church when her walker was moved away from her Has fallen in her room also---generally if she tries to get around without her rollator Counseled her about this  Eating fair Has been having a danish every morning Hasn't lost any other weight  Mood has been okay Occ gets a little mixed up---like getting up at 6AM and thinking it was later No regular depression now Does get here. Tries to get involved in some activities--enjoys organ player and singers when they come Tough when family goes away on vacation as she really can't go  No chest pain No SOB No edema with support stockings  No recent problems with heartburn  Current Outpatient Prescriptions on File Prior to Visit  Medication Sig Dispense Refill  . albuterol (VENTOLIN HFA) 108 (90 BASE) MCG/ACT inhaler Inhale 2 puffs into the lungs 3 (three) times daily as needed.        Marland Kitchen aspirin 81 MG EC tablet Take 81 mg by mouth daily.        Marland Kitchen atenolol (TENORMIN) 25 MG tablet Take 25 mg by mouth daily.        . ergocalciferol (VITAMIN D2) 50000 UNITS capsule Take 1 capsule (50,000 Units total) by mouth every 30 (thirty) days.  1 capsule  11  . latanoprost (XALATAN) 0.005 % ophthalmic solution Place 1 drop into both eyes at bedtime.        . mirtazapine (REMERON) 15 MG tablet Take 15 mg by mouth at bedtime.        Marland Kitchen omeprazole (PRILOSEC) 20 MG capsule Take 20 mg by mouth daily.        . polyethylene glycol (MIRALAX / GLYCOLAX) packet Take 17 g by mouth daily.        . ramipril (ALTACE) 5 MG tablet Take 5 mg by mouth daily.          Allergies  Allergen Reactions  . Sucralfate   . Tramadol     REACTION: hallucinations, paranoia    Past Medical History  Diagnosis Date  . GERD (gastroesophageal reflux disease)   . Hypertension   . Osteoporosis   . Anemia   .  Depression   . TIA (transient ischemic attack)   . Urinary incontinence   . Pulmonary hypertension     Past Surgical History  Procedure Date  . Abdominal hysterectomy     1 ovary left  . Perforated esophagus during dilation 1995  . Fracture surgery 1999    Hip  . Cholecystectomy 06/2008    Family History  Problem Relation Age of Onset  . Cancer Mother     brain cancer  . Heart disease Father     History   Social History  . Marital Status: Widowed    Spouse Name: N/A    Number of Children: 0  . Years of Education: N/A   Occupational History  . school teacher     retired   Social History Main Topics  . Smoking status: Never Smoker   . Smokeless tobacco: Never Used  . Alcohol Use: No  . Drug Use:   . Sexually Active:    Other Topics Concern  . Not on file   Social History Narrative  . No narrative on file   Review of Systems Sleeps okay Bowels have been  okay with miralax and prunes    Objective:   Physical Exam  Constitutional: She appears well-developed. No distress.  Neck: Normal range of motion. Neck supple.  Cardiovascular: Normal rate and regular rhythm.  Exam reveals no gallop.   Murmur heard.      Gr 2/6 systolic murmur along upper left sternal border  Pulmonary/Chest: Effort normal and breath sounds normal. No respiratory distress. She has no wheezes. She has no rales.  Abdominal: Soft. There is no tenderness.  Musculoskeletal: She exhibits edema.       Trace edema with hose  Lymphadenopathy:    She has no cervical adenopathy.  Psychiatric: She has a normal mood and affect. Her behavior is normal. Thought content normal.          Assessment & Plan:

## 2011-09-27 NOTE — Assessment & Plan Note (Signed)
BP Readings from Last 3 Encounters:  09/27/11 148/80  06/21/11 100/60  04/05/11 110/60   Has generally had good control Will continue current meds

## 2011-09-27 NOTE — Assessment & Plan Note (Signed)
Has finally stabilized She is having a danish in the morning in her room---then goes down for lunch and supper Continues on the mirtazapine

## 2011-10-08 ENCOUNTER — Emergency Department: Payer: Self-pay | Admitting: *Deleted

## 2011-10-08 LAB — URINALYSIS, COMPLETE
Bacteria: NONE SEEN
Bilirubin,UR: NEGATIVE
Blood: NEGATIVE
Glucose,UR: NEGATIVE mg/dL (ref 0–75)
Leukocyte Esterase: NEGATIVE
Ph: 7 (ref 4.5–8.0)
RBC,UR: 1 /HPF (ref 0–5)
Squamous Epithelial: <1
WBC UR: <1 /HPF (ref 0–5)

## 2011-10-08 LAB — COMPREHENSIVE METABOLIC PANEL
Albumin: 3.5 g/dL (ref 3.4–5.0)
Alkaline Phosphatase: 90 U/L (ref 50–136)
Anion Gap: 10 (ref 7–16)
Bilirubin,Total: 0.4 mg/dL (ref 0.2–1.0)
Calcium, Total: 8.9 mg/dL (ref 8.5–10.1)
Co2: 28 mmol/L (ref 21–32)
Creatinine: 0.8 mg/dL (ref 0.60–1.30)
Glucose: 78 mg/dL (ref 65–99)
Osmolality: 286 (ref 275–301)
Potassium: 4.5 mmol/L (ref 3.5–5.1)
SGOT(AST): 24 U/L (ref 15–37)
SGPT (ALT): 14 U/L
Sodium: 144 mmol/L (ref 136–145)

## 2011-10-08 LAB — CBC
MCH: 29.8 pg (ref 26.0–34.0)
MCHC: 32.2 g/dL (ref 32.0–36.0)
Platelet: 183 10*3/uL (ref 150–440)
WBC: 7.4 10*3/uL (ref 3.6–11.0)

## 2011-10-25 ENCOUNTER — Non-Acute Institutional Stay: Payer: Medicare Other | Admitting: Internal Medicine

## 2011-10-25 ENCOUNTER — Encounter: Payer: Self-pay | Admitting: Internal Medicine

## 2011-10-25 VITALS — BP 128/68 | HR 60 | Temp 97.9°F | Resp 18 | Wt 95.0 lb

## 2011-10-25 DIAGNOSIS — K219 Gastro-esophageal reflux disease without esophagitis: Secondary | ICD-10-CM

## 2011-10-25 DIAGNOSIS — R531 Weakness: Secondary | ICD-10-CM

## 2011-10-25 DIAGNOSIS — R634 Abnormal weight loss: Secondary | ICD-10-CM

## 2011-10-25 DIAGNOSIS — F329 Major depressive disorder, single episode, unspecified: Secondary | ICD-10-CM

## 2011-10-25 DIAGNOSIS — I10 Essential (primary) hypertension: Secondary | ICD-10-CM

## 2011-10-25 DIAGNOSIS — R5383 Other fatigue: Secondary | ICD-10-CM

## 2011-10-25 DIAGNOSIS — R5381 Other malaise: Secondary | ICD-10-CM

## 2011-10-25 DIAGNOSIS — F3289 Other specified depressive episodes: Secondary | ICD-10-CM

## 2011-10-25 NOTE — Progress Notes (Signed)
Subjective:    Patient ID: Lindsay Branch, female    DOB: Aug 01, 1913, 78 y.o.   MRN: [id number]  HPI Reviewed status with [name], RN Had another fall about 2 weeks ago--just walking in hall and was using rollator No chest pain, SOB , and didn't pass out ER evaluation negative and sent back  Has felt all right since then except for brief "funny feeling" around lunch one day---skipped the meal then Wondered about constipation --tried prunes and then had diarrhea  Has lost some more weight Now has aide for 2 hours twice a day---they are bringing hot breakfast every morning No nausea or vomiting No heartburn  No headaches No edema  Not depressed Does go down for some activities and enjoys this Enjoys having the aides come  Current Outpatient Prescriptions on File Prior to Visit  Medication Sig Dispense Refill  . albuterol (VENTOLIN HFA) 108 (90 BASE) MCG/ACT inhaler Inhale 2 puffs into the lungs 3 (three) times daily as needed.        Marland Kitchen aspirin 81 MG EC tablet Take 81 mg by mouth daily.        Marland Kitchen atenolol (TENORMIN) 25 MG tablet Take 25 mg by mouth daily.        . ergocalciferol (VITAMIN D2) 50000 UNITS capsule Take 1 capsule (50,000 Units total) by mouth every 30 (thirty) days.  1 capsule  11  . latanoprost (XALATAN) 0.005 % ophthalmic solution Place 1 drop into both eyes at bedtime.        . mirtazapine (REMERON) 15 MG tablet Take 15 mg by mouth at bedtime.        Marland Kitchen omeprazole (PRILOSEC) 20 MG capsule Take 20 mg by mouth every other day.       . polyethylene glycol (MIRALAX / GLYCOLAX) packet Take 17 g by mouth daily.        . ramipril (ALTACE) 5 MG tablet Take 5 mg by mouth daily.          Allergies  Allergen Reactions  . Sucralfate   . Tramadol     REACTION: hallucinations, paranoia    Past Medical History  Diagnosis Date  . GERD (gastroesophageal reflux disease)   . Hypertension   . Osteoporosis   . Anemia   . Depression   . TIA (transient ischemic attack)   .  Urinary incontinence   . Pulmonary hypertension     Past Surgical History  Procedure Date  . Abdominal hysterectomy     1 ovary left  . Perforated esophagus during dilation 1995  . Fracture surgery 1999    Hip  . Cholecystectomy 06/2008    Family History  Problem Relation Age of Onset  . Cancer Mother     brain cancer  . Heart disease Father     History   Social History  . Marital Status: Widowed    Spouse Name: N/A    Number of Children: 0  . Years of Education: N/A   Occupational History  . school teacher     retired   Social History Main Topics  . Smoking status: Never Smoker   . Smokeless tobacco: Never Used  . Alcohol Use: No  . Drug Use:   . Sexually Active:    Other Topics Concern  . Not on file   Social History Narrative  . No narrative on file   Review of Systems Sleeping okay-- occ awakens early in AM Does have regular nocturia     Objective:  Physical Exam  Constitutional: She appears well-developed. No distress.  Neck: Normal range of motion.  Cardiovascular: Normal rate and regular rhythm.  Exam reveals no gallop.   Murmur heard.      Fairly slow ~60 occ skips Gr 2/6 systolic murmur towards apex  Pulmonary/Chest: Effort normal and breath sounds normal. No respiratory distress. She has no wheezes. She has no rales.  Abdominal: Soft. There is no tenderness.  Musculoskeletal: She exhibits no edema and no tenderness.  Lymphadenopathy:    She has no cervical adenopathy.  Psychiatric: She has a normal mood and affect. Her behavior is normal. Judgment and thought content normal.          Assessment & Plan:

## 2011-10-25 NOTE — Assessment & Plan Note (Signed)
Has not been symptomatic despite cutting the omeprazole Will try off

## 2011-10-25 NOTE — Assessment & Plan Note (Signed)
BP Readings from Last 3 Encounters:  10/25/11 128/68  09/27/11 148/80  06/21/11 100/60   Has been fine Suspect she will do better without the atenolol

## 2011-10-25 NOTE — Assessment & Plan Note (Signed)
Mood seems okay on the mirtazapine

## 2011-10-25 NOTE — Assessment & Plan Note (Signed)
Ongoing issues Will try to decrease meds but continue mirtazapine Has aides twice a day---encouraging her to eat

## 2011-10-25 NOTE — Assessment & Plan Note (Signed)
With fall prompted recent ER visit No etiology found BP is okay---will try off the atenolol. Could be part of the problem

## 2011-10-26 ENCOUNTER — Observation Stay: Payer: Self-pay | Admitting: Internal Medicine

## 2011-10-26 LAB — CBC
HGB: 13.8 g/dL (ref 12.0–16.0)
Platelet: 141 10*3/uL — ABNORMAL LOW (ref 150–440)
RBC: 4.57 10*6/uL (ref 3.80–5.20)
WBC: 5.7 10*3/uL (ref 3.6–11.0)

## 2011-10-26 LAB — COMPREHENSIVE METABOLIC PANEL
Anion Gap: 9 (ref 7–16)
BUN: 14 mg/dL (ref 7–18)
Calcium, Total: 8.7 mg/dL (ref 8.5–10.1)
Chloride: 107 mmol/L (ref 98–107)
Co2: 29 mmol/L (ref 21–32)
Creatinine: 0.88 mg/dL (ref 0.60–1.30)
Osmolality: 288 (ref 275–301)
Potassium: 4 mmol/L (ref 3.5–5.1)
SGOT(AST): 16 U/L (ref 15–37)

## 2011-10-26 LAB — URINALYSIS, COMPLETE
Bilirubin,UR: NEGATIVE
Blood: NEGATIVE
Ketone: NEGATIVE
Leukocyte Esterase: NEGATIVE
Ph: 6 (ref 4.5–8.0)
Squamous Epithelial: <1
WBC UR: 1 /HPF (ref 0–5)

## 2011-10-26 LAB — TROPONIN I: Troponin-I: <0.02 ng/mL

## 2011-10-27 LAB — BASIC METABOLIC PANEL
Anion Gap: 10 (ref 7–16)
BUN: 15 mg/dL (ref 7–18)
Creatinine: 0.79 mg/dL (ref 0.60–1.30)
EGFR (African American): >60
EGFR (Non-African Amer.): >60
Osmolality: 288 (ref 275–301)

## 2011-10-30 DIAGNOSIS — R634 Abnormal weight loss: Secondary | ICD-10-CM

## 2011-10-30 DIAGNOSIS — I10 Essential (primary) hypertension: Secondary | ICD-10-CM

## 2011-10-30 DIAGNOSIS — G459 Transient cerebral ischemic attack, unspecified: Secondary | ICD-10-CM

## 2011-10-30 DIAGNOSIS — F0150 Vascular dementia without behavioral disturbance: Secondary | ICD-10-CM

## 2011-12-21 ENCOUNTER — Other Ambulatory Visit: Payer: Self-pay | Admitting: *Deleted

## 2011-12-21 ENCOUNTER — Encounter: Payer: Self-pay | Admitting: Internal Medicine

## 2012-01-17 ENCOUNTER — Encounter: Payer: Self-pay | Admitting: Internal Medicine

## 2012-01-17 ENCOUNTER — Non-Acute Institutional Stay: Payer: Medicare Other | Admitting: Internal Medicine

## 2012-01-17 VITALS — BP 120/60 | HR 85 | Temp 97.4°F | Resp 20 | Wt 95.0 lb

## 2012-01-17 DIAGNOSIS — R5383 Other fatigue: Secondary | ICD-10-CM

## 2012-01-17 DIAGNOSIS — I079 Rheumatic tricuspid valve disease, unspecified: Secondary | ICD-10-CM

## 2012-01-17 DIAGNOSIS — I10 Essential (primary) hypertension: Secondary | ICD-10-CM

## 2012-01-17 DIAGNOSIS — F329 Major depressive disorder, single episode, unspecified: Secondary | ICD-10-CM

## 2012-01-17 DIAGNOSIS — R5381 Other malaise: Secondary | ICD-10-CM

## 2012-01-17 DIAGNOSIS — R531 Weakness: Secondary | ICD-10-CM

## 2012-01-17 DIAGNOSIS — K219 Gastro-esophageal reflux disease without esophagitis: Secondary | ICD-10-CM

## 2012-01-17 NOTE — Assessment & Plan Note (Signed)
No symptoms on omeprazole Has history of perforated esophagus so I will continue this indefinitely

## 2012-01-17 NOTE — Progress Notes (Signed)
Subjective:    Patient ID: Lindsay Branch, female    DOB: December 02, 1912, 78 y.o.   MRN: [id number]  HPI Reviewed status with [name] RN Her neighbor/advocate is here--Art Maisie Fus  Doing okay No falls Still has aide come in 2 hours every morning and night Not eating great but okay Weight has been stable  Walks with rollator Able to use bathroom on her own Goes down for lunch and supper  Has some down periods "once in a while" No persistent depression  No chest pain No SOB no edema--uses support hose  No problems with heartburn Still on the prilosec  Current Outpatient Prescriptions on File Prior to Visit  Medication Sig Dispense Refill  . aspirin 81 MG EC tablet Take 81 mg by mouth daily.        . ergocalciferol (VITAMIN D2) 50000 UNITS capsule Take 1 capsule (50,000 Units total) by mouth every 30 (thirty) days.  1 capsule  11  . latanoprost (XALATAN) 0.005 % ophthalmic solution Place 1 drop into both eyes at bedtime.        . mirtazapine (REMERON) 15 MG tablet Take 15 mg by mouth at bedtime.        Marland Kitchen omeprazole (PRILOSEC) 20 MG capsule Take 1 capsule (20 mg total) by mouth daily.  30 capsule  3    Allergies  Allergen Reactions  . Sucralfate   . Tramadol     REACTION: hallucinations, paranoia    Past Medical History  Diagnosis Date  . GERD (gastroesophageal reflux disease)     with past esophageal dilatation  . Hypertension   . Osteoporosis   . Anemia   . Depression   . TIA (transient ischemic attack)   . Urinary incontinence   . Pulmonary hypertension     Past Surgical History  Procedure Date  . Abdominal hysterectomy     1 ovary left  . Perforated esophagus during dilation 1995  . Fracture surgery 1999    Hip  . Cholecystectomy 06/2008    Family History  Problem Relation Age of Onset  . Cancer Mother     brain cancer  . Heart disease Father     History   Social History  . Marital Status: Widowed    Spouse Name: N/A    Number of Children: 0    . Years of Education: N/A   Occupational History  . school teacher     retired   Social History Main Topics  . Smoking status: Never Smoker   . Smokeless tobacco: Never Used  . Alcohol Use: No  . Drug Use:   . Sexually Active:    Other Topics Concern  . Not on file   Social History Narrative  . No narrative on file   Review of Systems Sleeps okay Bowels are fine    Objective:   Physical Exam  Constitutional: She appears well-developed and well-nourished. No distress.  Neck: Normal range of motion. Neck supple. No thyromegaly present.  Cardiovascular: Normal rate.  Exam reveals no gallop.   Murmur heard.      Gr 2/6 systolic murmur towards apex  Pulmonary/Chest: Effort normal and breath sounds normal. No respiratory distress. She has no wheezes. She has no rales.  Abdominal: Soft. There is no tenderness.  Musculoskeletal: She exhibits no edema and no tenderness.  Lymphadenopathy:    She has no cervical adenopathy.  Psychiatric: She has a normal mood and affect. Her behavior is normal.  Assessment & Plan:

## 2012-01-17 NOTE — Assessment & Plan Note (Signed)
Only occ mild spells Will continue the mirtazapine as it may help her appetite as well

## 2012-01-17 NOTE — Assessment & Plan Note (Signed)
Soft murmur only Edema is better so doubt this is a sig issue No reason to continue cardiology follow up unless she has a problem

## 2012-01-17 NOTE — Assessment & Plan Note (Signed)
Ongoing issue Seems to have stabilized and is doing okay here in [place] with the extra help No changes

## 2012-01-17 NOTE — Assessment & Plan Note (Signed)
BP Readings from Last 3 Encounters:  01/17/12 120/60  10/25/11 128/68  09/27/11 148/80   Good control without meds

## 2012-04-17 ENCOUNTER — Encounter: Payer: Self-pay | Admitting: Internal Medicine

## 2012-04-17 ENCOUNTER — Non-Acute Institutional Stay: Payer: Medicare Other | Admitting: Internal Medicine

## 2012-04-17 VITALS — BP 110/60 | HR 68 | Temp 97.4°F | Resp 20 | Wt 98.0 lb

## 2012-04-17 DIAGNOSIS — R531 Weakness: Secondary | ICD-10-CM

## 2012-04-17 DIAGNOSIS — F329 Major depressive disorder, single episode, unspecified: Secondary | ICD-10-CM

## 2012-04-17 DIAGNOSIS — R5383 Other fatigue: Secondary | ICD-10-CM

## 2012-04-17 DIAGNOSIS — K219 Gastro-esophageal reflux disease without esophagitis: Secondary | ICD-10-CM

## 2012-04-17 DIAGNOSIS — I079 Rheumatic tricuspid valve disease, unspecified: Secondary | ICD-10-CM

## 2012-04-17 DIAGNOSIS — I10 Essential (primary) hypertension: Secondary | ICD-10-CM

## 2012-04-17 DIAGNOSIS — R5381 Other malaise: Secondary | ICD-10-CM

## 2012-04-17 DIAGNOSIS — F3289 Other specified depressive episodes: Secondary | ICD-10-CM

## 2012-04-17 NOTE — Progress Notes (Signed)
Subjective:    Patient ID: Lindsay Branch, female    DOB: [birth year], 78 y.o.   MRN: [id number]  HPI Here with aide Reviewed status with Lindsay Felling RN  Still has aide each morning Takes breakfast in her room then goes down for lunch and dinner  Ongoing weakness but no progression Walks with rollator  Does all personal care including bathroom.  Assist with showering  No chest pain No SOB No edema No dizziness or syncope  Did have fall 2-3 weeks ago---bending to pick up Lindsay Branch in bathroom also Only minor bruises  No heartburn Some swallowing problems---prior decision to continue PPI due to this Still with diarrhea----several loose stools per day  Current Outpatient Prescriptions on File Prior to Visit  Medication Sig Dispense Refill  . aspirin 81 MG EC tablet Take 81 mg by mouth daily.        . ergocalciferol (VITAMIN D2) 50000 UNITS capsule Take 1 capsule (50,000 Units total) by mouth every 30 (thirty) days.  1 capsule  11  . latanoprost (XALATAN) 0.005 % ophthalmic solution Place 1 drop into both eyes at bedtime.        . mirtazapine (REMERON) 15 MG tablet Take 15 mg by mouth at bedtime.        Marland Kitchen omeprazole (PRILOSEC) 20 MG capsule Take 1 capsule (20 mg total) by mouth daily.  30 capsule  3    Allergies  Allergen Reactions  . Sucralfate   . Tramadol     REACTION: hallucinations, paranoia    Past Medical History  Diagnosis Date  . GERD (gastroesophageal reflux disease)     with past esophageal dilatation  . Hypertension   . Osteoporosis   . Anemia   . Depression   . TIA (transient ischemic attack)   . Urinary incontinence   . Pulmonary hypertension     Past Surgical History  Procedure Date  . Abdominal hysterectomy     1 ovary left  . Perforated esophagus during dilation 1995  . Fracture surgery 1999    Hip  . Cholecystectomy 06/2008    Family History  Problem Relation Age of Onset  . Cancer Mother     brain cancer  . Heart disease Father       History   Social History  . Marital Status: Widowed    Spouse Name: N/A    Number of Children: 0  . Years of Education: N/A   Occupational History  . school teacher     retired   Social History Main Topics  . Smoking status: Never Smoker   . Smokeless tobacco: Never Used  . Alcohol Use: No  . Drug Use:   . Sexually Active:    Other Topics Concern  . Not on file   Social History Narrative  . No narrative on file   Review of Systems 2 teeth fell out. Had crown and implant.  Working okay and able to chew. Weight up 3# Occ low back pain. Discussed poor sitting posture and will get a lumbar support for her    Objective:   Physical Exam  Constitutional: She appears well-developed. No distress.  Neck: Normal range of motion. Neck supple. No thyromegaly present.  Cardiovascular: Normal rate and regular rhythm.  Exam reveals no gallop.   Murmur heard.      Soft systolic murmur at lower left sternal border  Pulmonary/Chest: Effort normal and breath sounds normal. No respiratory distress. She has no wheezes. She has no rales.  Abdominal:  Soft. There is no tenderness.  Musculoskeletal: She exhibits no edema and no tenderness.  Lymphadenopathy:    She has no cervical adenopathy.  Psychiatric: She has a normal mood and affect. Her behavior is normal.          Assessment & Plan:

## 2012-04-17 NOTE — Assessment & Plan Note (Signed)
Mood seems level Will continue the mirtazapine

## 2012-04-17 NOTE — Assessment & Plan Note (Signed)
No signs of right heart failure Will observe only

## 2012-04-17 NOTE — Assessment & Plan Note (Signed)
Ongoing fraility Doing okay in AL with twice a day aide for 2 hours Has had some falls but no serious injury

## 2012-04-17 NOTE — Assessment & Plan Note (Signed)
Symptoms controlled Still has slight swallowing issues so will continue the prilosec

## 2012-04-17 NOTE — Assessment & Plan Note (Signed)
Still okay without Rx BP Readings from Last 3 Encounters:  04/17/12 110/60  01/17/12 120/60  10/25/11 128/68

## 2012-04-29 ENCOUNTER — Encounter: Payer: Self-pay | Admitting: Internal Medicine

## 2012-05-02 ENCOUNTER — Inpatient Hospital Stay: Payer: Self-pay | Admitting: Internal Medicine

## 2012-05-02 LAB — COMPREHENSIVE METABOLIC PANEL
Anion Gap: 9 (ref 7–16)
BUN: 18 mg/dL (ref 7–18)
Calcium, Total: 8.8 mg/dL (ref 8.5–10.1)
Chloride: 105 mmol/L (ref 98–107)
Co2: 29 mmol/L (ref 21–32)
EGFR (African American): >60
EGFR (Non-African Amer.): >60
Potassium: 4.1 mmol/L (ref 3.5–5.1)
SGOT(AST): 25 U/L (ref 15–37)
SGPT (ALT): 16 U/L
Total Protein: 6.5 g/dL (ref 6.4–8.2)

## 2012-05-02 LAB — URINALYSIS, COMPLETE
Glucose,UR: NEGATIVE mg/dL (ref 0–75)
Hyaline Cast: 1
Ketone: NEGATIVE
Leukocyte Esterase: NEGATIVE
Nitrite: NEGATIVE
Ph: 8 (ref 4.5–8.0)
Protein: NEGATIVE
RBC,UR: <1 /HPF (ref 0–5)
Squamous Epithelial: 3

## 2012-05-02 LAB — PROTIME-INR: Prothrombin Time: 13 secs (ref 11.5–14.7)

## 2012-05-02 LAB — CBC
HCT: 38.8 % (ref 35.0–47.0)
Platelet: 200 10*3/uL (ref 150–440)
RBC: 4.14 10*6/uL (ref 3.80–5.20)
RDW: 13.9 % (ref 11.5–14.5)
WBC: 7.5 10*3/uL (ref 3.6–11.0)

## 2012-05-03 LAB — BASIC METABOLIC PANEL
Anion Gap: 8 (ref 7–16)
Calcium, Total: 8.4 mg/dL — ABNORMAL LOW (ref 8.5–10.1)
Co2: 28 mmol/L (ref 21–32)
EGFR (African American): >60
Osmolality: 288 (ref 275–301)

## 2012-05-04 LAB — MAGNESIUM: Magnesium: 2.2 mg/dL

## 2012-05-08 DIAGNOSIS — R634 Abnormal weight loss: Secondary | ICD-10-CM

## 2012-05-08 DIAGNOSIS — F0150 Vascular dementia without behavioral disturbance: Secondary | ICD-10-CM

## 2012-05-08 DIAGNOSIS — I10 Essential (primary) hypertension: Secondary | ICD-10-CM

## 2012-05-08 DIAGNOSIS — G459 Transient cerebral ischemic attack, unspecified: Secondary | ICD-10-CM

## 2012-05-14 ENCOUNTER — Ambulatory Visit: Payer: Medicare Other | Admitting: Internal Medicine

## 2012-06-17 DIAGNOSIS — R634 Abnormal weight loss: Secondary | ICD-10-CM

## 2012-06-17 DIAGNOSIS — F0150 Vascular dementia without behavioral disturbance: Secondary | ICD-10-CM

## 2012-06-17 DIAGNOSIS — I6990 Unspecified sequelae of unspecified cerebrovascular disease: Secondary | ICD-10-CM

## 2012-06-17 DIAGNOSIS — I10 Essential (primary) hypertension: Secondary | ICD-10-CM

## 2012-06-17 IMAGING — CT CT HEAD WITHOUT CONTRAST
2 series · 16 of 30 positions shown, 20 images · non-contrast
Comparison: none

REASON FOR EXAM: confusion,
COMMENTS:

[Series 2: without · axial · non-contrast · 0.40mm/px · z∈[-277,-147]mm · 13 of 32 slices shown, 17 images]
[im 3/32  brain]
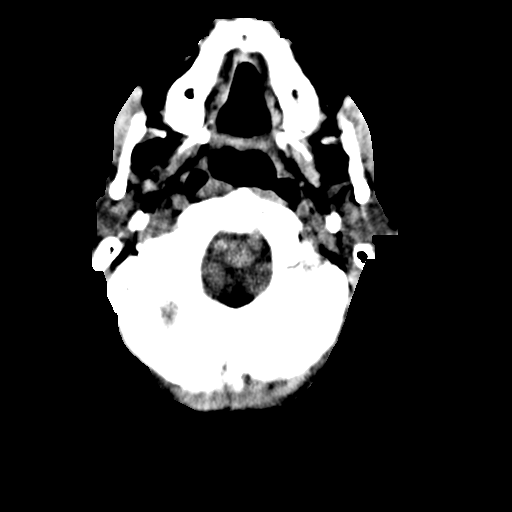
[im 3/32  bone]
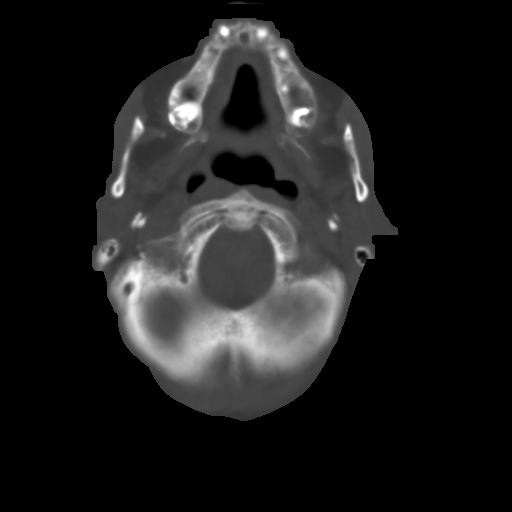
[im 5/32  brain]
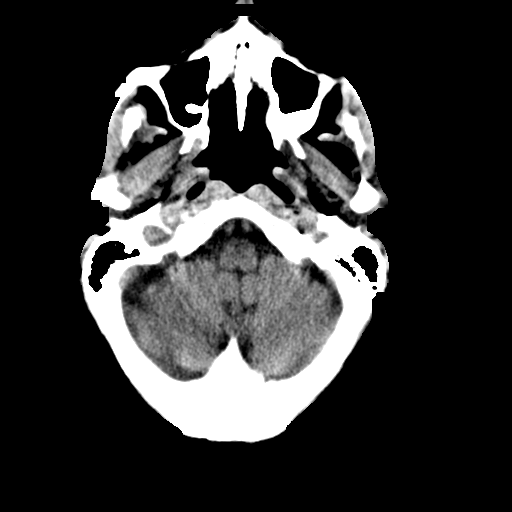
[im 7/32  brain]
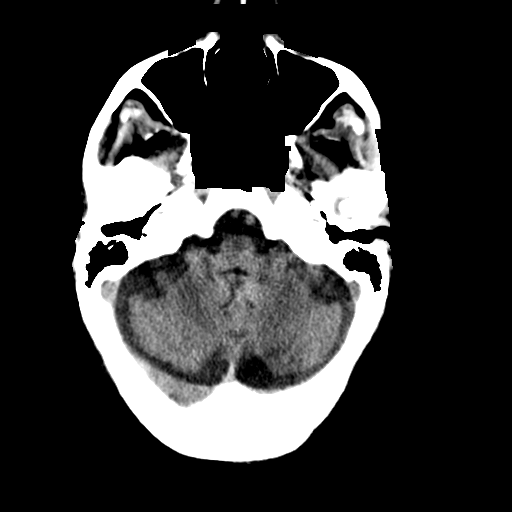
[im 9/32  brain]
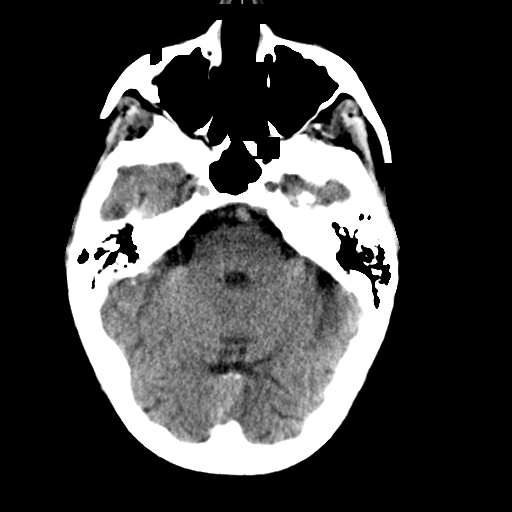
[im 12/32  brain]
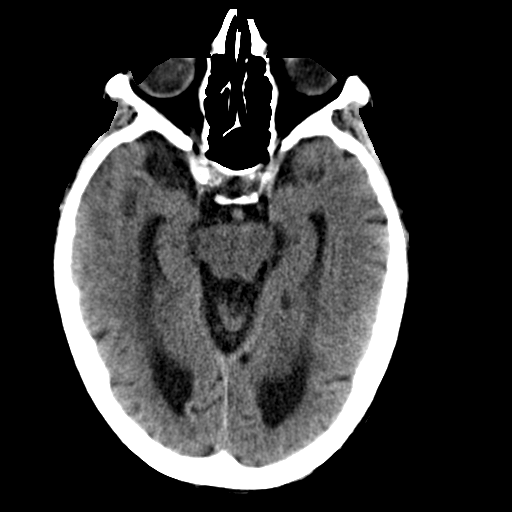
[im 12/32  bone]
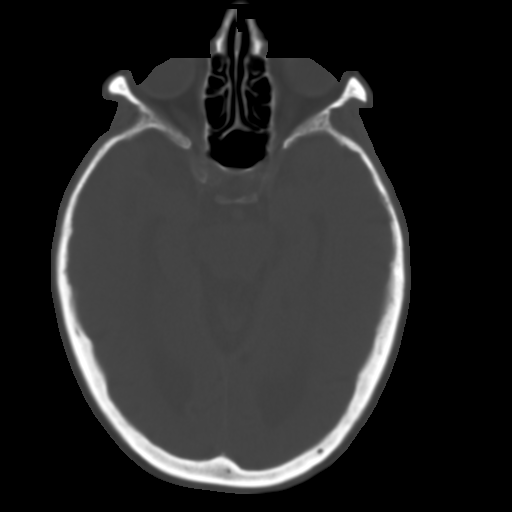
[im 14/32  brain]
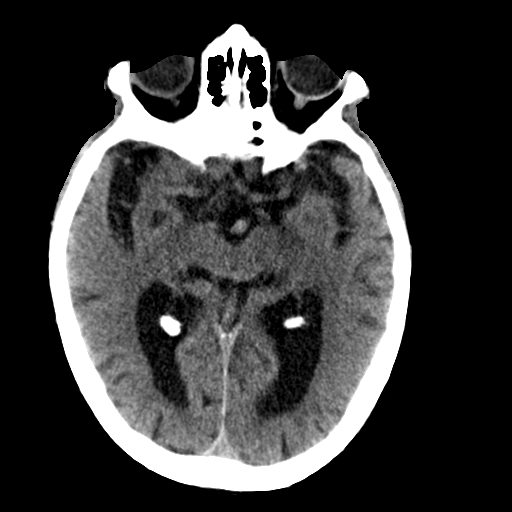
[im 16/32  brain]
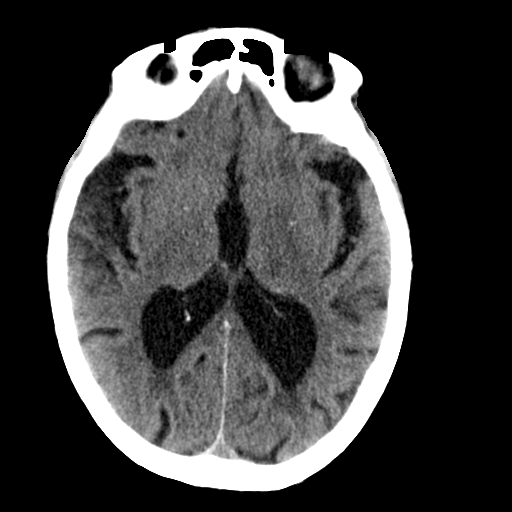
[im 18/32  brain]
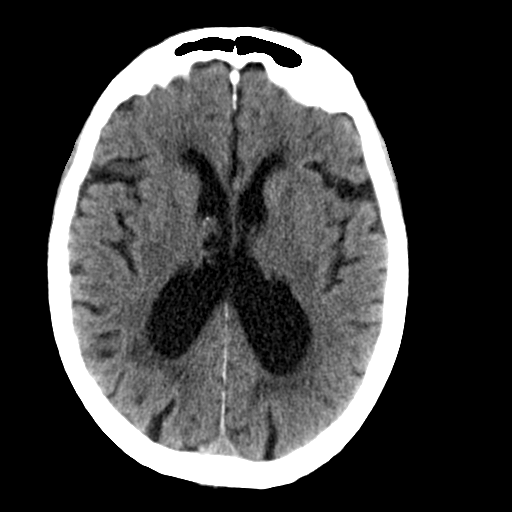
[im 20/32  brain]
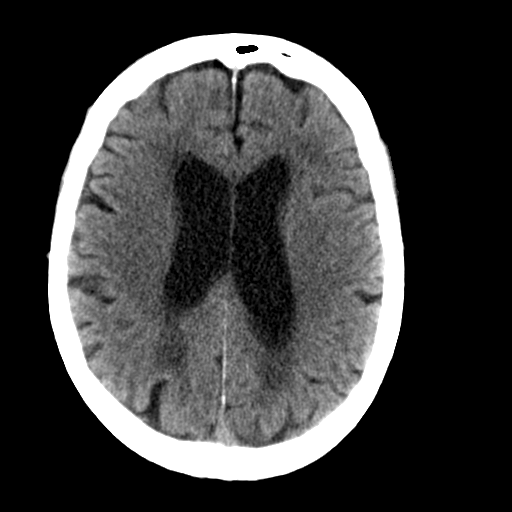
[im 20/32  bone]
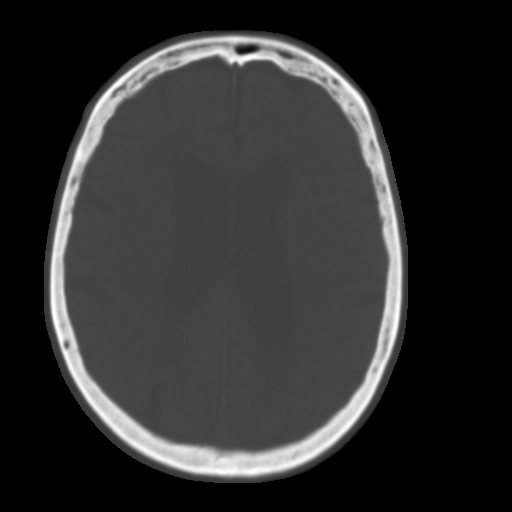
[im 23/32  brain]
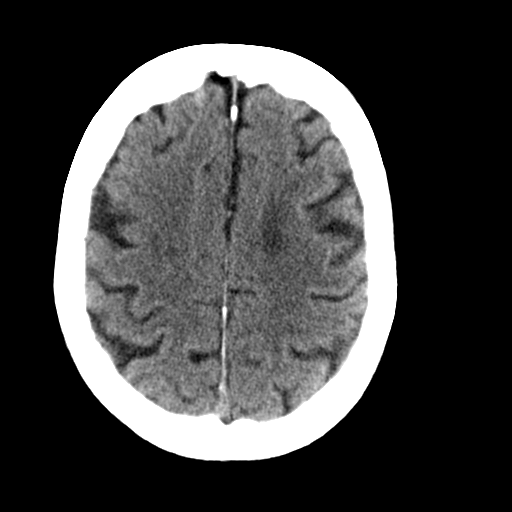
[im 25/32  brain]
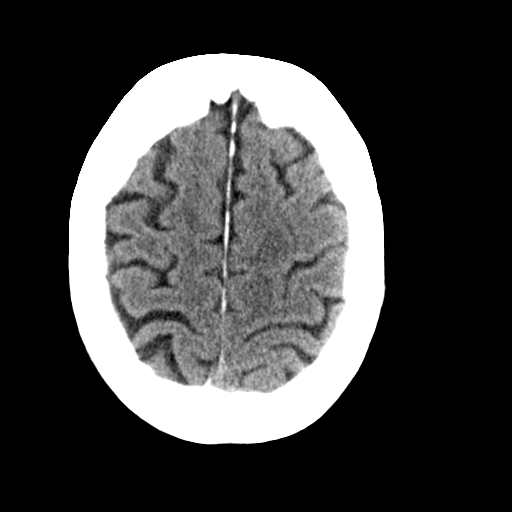
[im 27/32  brain]
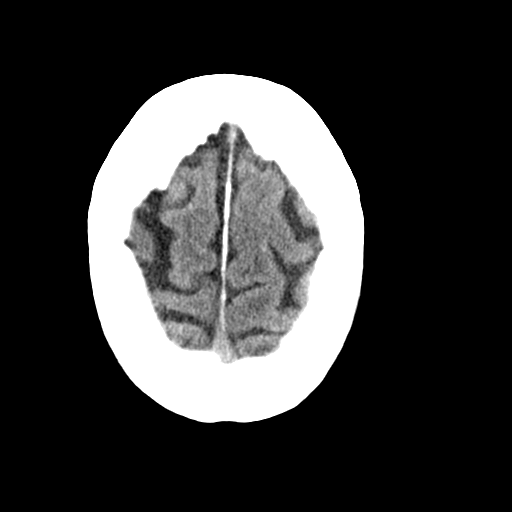
[im 29/32  brain]
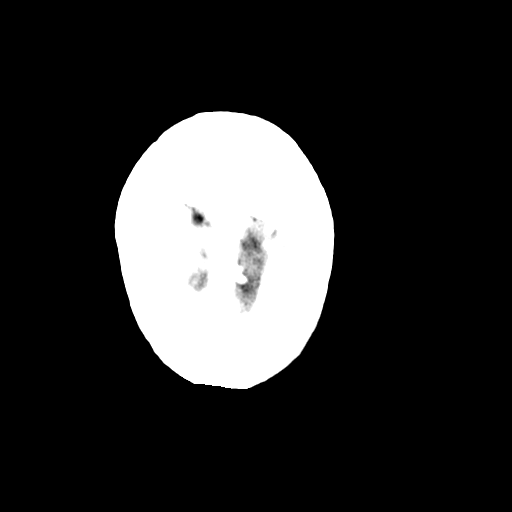
[im 29/32  bone]
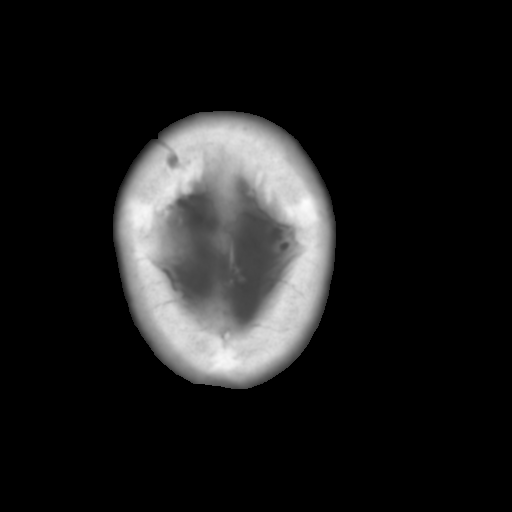

[Series 3: bone · axial · 0.40mm/px · z∈[-277,-232]mm · 3 of 32 slices shown]
[im 3/32  bone]
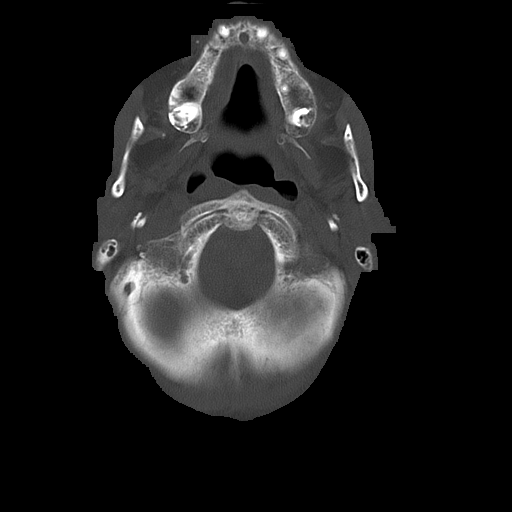
[im 7/32  bone]
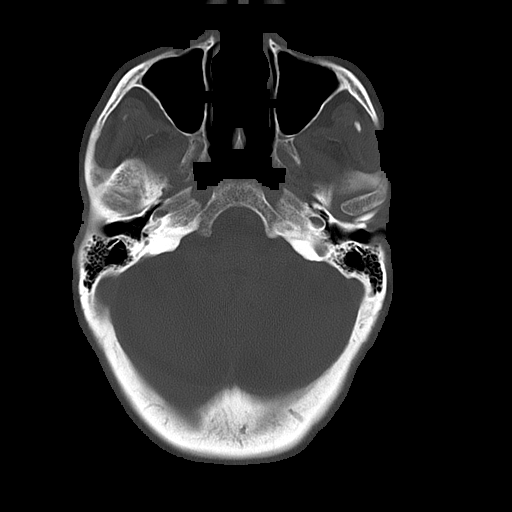
[im 12/32  bone]
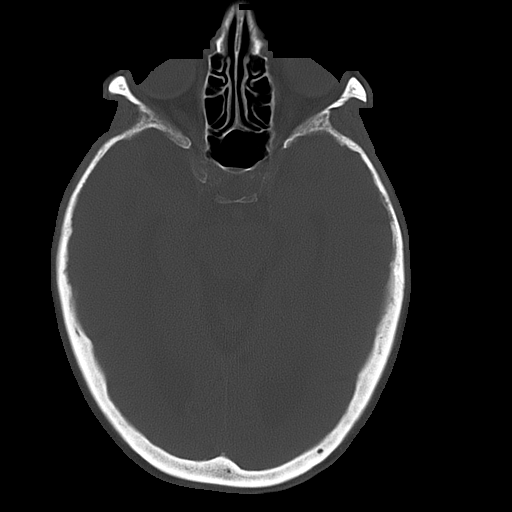

[16 of 30 positions shown; findings below may reference images not displayed]

PROCEDURE:     CT  - CT HEAD WITHOUT CONTRAST  - October 08, 2011  [DATE]

RESULT:     CT of the brain without contrast demonstrates prominence of the
ventricles and sulci. Low-attenuation is seen diffusely within the
periventricular and subcortical white matter. There is no evidence of
intracranial hemorrhage, mass or mass effect. No large territorial infarct
is evident. The included sinuses and mastoid air cells show normal aeration.
The calvarium is intact.
IMPRESSION: 1. Changes of atrophy with chronic small vessel ischemic disease. No acute
intracranial abnormality evident.

## 2012-07-08 DIAGNOSIS — I10 Essential (primary) hypertension: Secondary | ICD-10-CM

## 2012-07-08 DIAGNOSIS — K222 Esophageal obstruction: Secondary | ICD-10-CM

## 2012-07-08 DIAGNOSIS — I6990 Unspecified sequelae of unspecified cerebrovascular disease: Secondary | ICD-10-CM

## 2012-07-08 DIAGNOSIS — F068 Other specified mental disorders due to known physiological condition: Secondary | ICD-10-CM

## 2012-08-14 DIAGNOSIS — I10 Essential (primary) hypertension: Secondary | ICD-10-CM

## 2012-08-14 DIAGNOSIS — I6990 Unspecified sequelae of unspecified cerebrovascular disease: Secondary | ICD-10-CM

## 2012-08-14 DIAGNOSIS — K222 Esophageal obstruction: Secondary | ICD-10-CM

## 2012-08-14 DIAGNOSIS — F068 Other specified mental disorders due to known physiological condition: Secondary | ICD-10-CM

## 2012-10-15 ENCOUNTER — Emergency Department: Payer: Self-pay | Admitting: Emergency Medicine

## 2012-10-15 ENCOUNTER — Telehealth: Payer: Self-pay | Admitting: Internal Medicine

## 2012-10-15 DIAGNOSIS — R4182 Altered mental status, unspecified: Secondary | ICD-10-CM

## 2012-10-15 DIAGNOSIS — I10 Essential (primary) hypertension: Secondary | ICD-10-CM

## 2012-10-15 DIAGNOSIS — R454 Irritability and anger: Secondary | ICD-10-CM

## 2012-10-15 NOTE — Telephone Encounter (Signed)
[facility] Triage Call Report Triage Record Num: [id number] Operator: Kelle Darting Patient Name: Lindsay Branch Call Date & Time: 10/14/2012 9:49:15PM Patient Phone: [phone] PCP: Tillman Abide Patient Gender: Female PCP Fax : [phone] Patient DOB: [birth year] [facility] Name[facility] Reason for Call: Caller: Tom/RN at the [employer]; PCP: Tillman Abide [facility]); CB#: [phone]; Call regarding fall; Afebrile; Onset: 10/14/12; Sx notes: patient found on floor, states that it appears that she had stood up and fell, hitting the night stand, has a small cut to the right top of her head, bleeding has stopped now, non-gaping, skin tear noted and hematoma; Vitals: Blood pressure: 150/90; Heart rate: 88; Temp. 97.6; Respirations: 23; patient does complain of a mild head ache, is alert and oriented; Staff states they are going to do hourly neuro checks on patient per their standing orders; Guideline used: Head Injury; Disposition: See ED Immediately due to head injury AND 69 of age or older; Attempted to contact Dr. Everardo All, unable to; At this time 2332: Patient is resting at this time, denies headache at this time, acting her normal self; Neuro checks have been good; Vitals at 2330: Blood pressure: 138/82;Heart Rate: 76;Respirations: 18; Code Status: Full Code; Will have staff send patient to the ED for evaluation per Head Injury Guideline; Staff sending patient to [facility] ED. Protocol(s) Used: Head Injury Recommended Outcome per Protocol: See ED Immediately Reason for Outcome: Head injury AND 65 years of age or older Care Advice: ~ 10/14/2012 11:56:47PM Page 1 of 1 [email or web address]

## 2012-10-15 NOTE — Telephone Encounter (Signed)
Adrienne,  Could you contact [facility] for me. I would like to speak directly to the manager about how this call was taken care of. I believe their nurses are not properly accounting for the fact that my nursing home patients are being evaluated by licensed nurses and just need to call per protocol There is nothing in this note that I feel warrants the patient being sent to the ER on this occasion!!

## 2012-12-18 DIAGNOSIS — I6990 Unspecified sequelae of unspecified cerebrovascular disease: Secondary | ICD-10-CM

## 2012-12-18 DIAGNOSIS — I10 Essential (primary) hypertension: Secondary | ICD-10-CM

## 2012-12-18 DIAGNOSIS — F068 Other specified mental disorders due to known physiological condition: Secondary | ICD-10-CM

## 2012-12-18 DIAGNOSIS — K222 Esophageal obstruction: Secondary | ICD-10-CM

## 2013-02-19 DIAGNOSIS — K222 Esophageal obstruction: Secondary | ICD-10-CM

## 2013-02-19 DIAGNOSIS — I10 Essential (primary) hypertension: Secondary | ICD-10-CM

## 2013-02-19 DIAGNOSIS — F0150 Vascular dementia without behavioral disturbance: Secondary | ICD-10-CM

## 2013-02-19 DIAGNOSIS — I6990 Unspecified sequelae of unspecified cerebrovascular disease: Secondary | ICD-10-CM

## 2013-02-25 ENCOUNTER — Telehealth: Payer: Self-pay

## 2013-02-25 NOTE — Telephone Encounter (Signed)
Tom RN at [facility] said pt K 6.1  Advised Tom, Dr Alphonsus Sias is not in the office and Elijah Birk will page Dr Alphonsus Sias now to give results.[facility] agreed).

## 2013-02-26 NOTE — Telephone Encounter (Signed)
Just ordered repeat today---still pending

## 2013-02-27 ENCOUNTER — Telehealth: Payer: Self-pay

## 2013-02-27 ENCOUNTER — Encounter: Payer: Self-pay | Admitting: Internal Medicine

## 2013-02-27 NOTE — Telephone Encounter (Signed)
Discussed with her It was a repeat potassium that was normal

## 2013-02-27 NOTE — Telephone Encounter (Signed)
Cheryl nurse with [facility] wanted to verify Dr Alphonsus Sias got faxed labs on pt; if did not get lab results call Cheryl at [phone].

## 2013-03-01 ENCOUNTER — Inpatient Hospital Stay: Payer: Self-pay | Admitting: Internal Medicine

## 2013-03-01 LAB — URINALYSIS, COMPLETE
Ketone: NEGATIVE
Nitrite: NEGATIVE
Ph: 5 (ref 4.5–8.0)
Protein: NEGATIVE
RBC,UR: 7 /HPF (ref 0–5)
Specific Gravity: 1.009 (ref 1.003–1.030)
Squamous Epithelial: 309
WBC UR: 16 /HPF (ref 0–5)

## 2013-03-01 LAB — CBC
HCT: 35.2 % (ref 35.0–47.0)
HGB: 12 g/dL (ref 12.0–16.0)
MCH: 30.6 pg (ref 26.0–34.0)
MCV: 90 fL (ref 80–100)
Platelet: 247 10*3/uL (ref 150–440)

## 2013-03-01 LAB — COMPREHENSIVE METABOLIC PANEL
Albumin: 3.6 g/dL (ref 3.4–5.0)
Alkaline Phosphatase: 96 U/L (ref 50–136)
Anion Gap: 8 (ref 7–16)
BUN: 27 mg/dL — ABNORMAL HIGH (ref 7–18)
Bilirubin,Total: 0.4 mg/dL (ref 0.2–1.0)
Calcium, Total: 8.8 mg/dL (ref 8.5–10.1)
Co2: 26 mmol/L (ref 21–32)
Creatinine: 0.67 mg/dL (ref 0.60–1.30)
EGFR (Non-African Amer.): >60
Glucose: 83 mg/dL (ref 65–99)
Osmolality: 252 (ref 275–301)
Potassium: 5.5 mmol/L — ABNORMAL HIGH (ref 3.5–5.1)
Sodium: 123 mmol/L — ABNORMAL LOW (ref 136–145)
Total Protein: 6.5 g/dL (ref 6.4–8.2)

## 2013-03-01 LAB — TROPONIN I
Troponin-I: <0.02 ng/mL
Troponin-I: <0.02 ng/mL
Troponin-I: <0.02 ng/mL

## 2013-03-01 LAB — CK TOTAL AND CKMB (NOT AT ARMC)
CK, Total: 97 U/L (ref 21–215)
CK-MB: 3.3 ng/mL (ref 0.5–3.6)
CK-MB: 3.4 ng/mL (ref 0.5–3.6)
CK-MB: 3.7 ng/mL — ABNORMAL HIGH (ref 0.5–3.6)

## 2013-03-02 ENCOUNTER — Telehealth: Payer: Self-pay | Admitting: *Deleted

## 2013-03-02 LAB — BASIC METABOLIC PANEL
Anion Gap: 7 (ref 7–16)
Chloride: 104 mmol/L (ref 98–107)
Creatinine: 0.64 mg/dL (ref 0.60–1.30)
EGFR (African American): >60
Potassium: 4.7 mmol/L (ref 3.5–5.1)
Sodium: 133 mmol/L — ABNORMAL LOW (ref 136–145)

## 2013-03-02 NOTE — Telephone Encounter (Signed)
Labs didn't show dehydration at [facility] but did have change in status Was admitted but haven't heard anything about the situation yet

## 2013-03-02 NOTE — Telephone Encounter (Signed)
Reason for Call: Caller: Cathy/LPN; PCP: Tillman Abide [facility]); CB#: [phone]; Call regarding Dehydration, possible UTI, family has already called tonight. Pt refusing fluids and food. Mental status changes, depressed, lethargic.; See previous call. Lynden Ang, LPN at [facility] reports on 02/28/13 pt is very depressed, appears confused and is worsening, refusing food and drink. T98.6, pulse 66, resp 20, BP 120/60. Emergent sxs of " New or worsening confusion, disorientation, or agitation" per Confusion, Disorientation,Agitation protocol. RN instructed ED evaluation now. Caller states pt will be transported by EMS to [facility], probably. Protocol(s) Used: Confusion, Disorientation, Agitation Recommended Outcome per Protocol: See ED Immediately Reason for Outcome: New or worsening confusion, disorientation, or agitation Care Advice: ~ Do not leave patient alone. Call EMS 911 if confusion / disorientation / agitation becomes unmanageable to the point of causing harm to self or others. ~ Write down provider's name. List or place the following in a bag for transport with the patient: current prescription and/or nonprescription medications; alternative treatments, therapies and medications; and street drugs. ~ 05/

## 2013-03-03 LAB — URINE CULTURE

## 2013-03-05 DIAGNOSIS — E871 Hypo-osmolality and hyponatremia: Secondary | ICD-10-CM

## 2013-03-06 LAB — CULTURE, BLOOD (SINGLE)

## 2013-04-23 DIAGNOSIS — I10 Essential (primary) hypertension: Secondary | ICD-10-CM

## 2013-04-23 DIAGNOSIS — I6990 Unspecified sequelae of unspecified cerebrovascular disease: Secondary | ICD-10-CM

## 2013-04-23 DIAGNOSIS — F0150 Vascular dementia without behavioral disturbance: Secondary | ICD-10-CM

## 2013-04-23 DIAGNOSIS — K222 Esophageal obstruction: Secondary | ICD-10-CM

## 2013-05-12 ENCOUNTER — Emergency Department: Payer: Self-pay | Admitting: Emergency Medicine

## 2013-06-04 DIAGNOSIS — K5900 Constipation, unspecified: Secondary | ICD-10-CM

## 2013-06-04 DIAGNOSIS — F0150 Vascular dementia without behavioral disturbance: Secondary | ICD-10-CM

## 2013-06-04 DIAGNOSIS — K222 Esophageal obstruction: Secondary | ICD-10-CM

## 2013-06-04 DIAGNOSIS — I6789 Other cerebrovascular disease: Secondary | ICD-10-CM

## 2013-06-04 DIAGNOSIS — I10 Essential (primary) hypertension: Secondary | ICD-10-CM

## 2013-07-13 DIAGNOSIS — R05 Cough: Secondary | ICD-10-CM

## 2013-08-06 DIAGNOSIS — F0150 Vascular dementia without behavioral disturbance: Secondary | ICD-10-CM

## 2013-08-06 DIAGNOSIS — I6990 Unspecified sequelae of unspecified cerebrovascular disease: Secondary | ICD-10-CM

## 2013-08-06 DIAGNOSIS — I10 Essential (primary) hypertension: Secondary | ICD-10-CM

## 2013-08-06 DIAGNOSIS — K222 Esophageal obstruction: Secondary | ICD-10-CM

## 2013-09-13 ENCOUNTER — Inpatient Hospital Stay: Payer: Self-pay | Admitting: Internal Medicine

## 2013-09-13 LAB — CBC WITH DIFFERENTIAL/PLATELET
Basophil #: 0 10*3/uL (ref 0.0–0.1)
Eosinophil #: 0 10*3/uL (ref 0.0–0.7)
Eosinophil %: 0.4 %
HCT: 42.2 % (ref 35.0–47.0)
Lymphocyte %: 18.9 %
MCH: 29.2 pg (ref 26.0–34.0)
MCHC: 32.3 g/dL (ref 32.0–36.0)
MCV: 91 fL (ref 80–100)
Monocyte #: 0.9 x10 3/mm (ref 0.2–0.9)
Neutrophil #: 5.3 10*3/uL (ref 1.4–6.5)
Neutrophil %: 68.6 %
Platelet: 164 10*3/uL (ref 150–440)
WBC: 7.7 10*3/uL (ref 3.6–11.0)

## 2013-09-13 LAB — COMPREHENSIVE METABOLIC PANEL
Albumin: 3.4 g/dL (ref 3.4–5.0)
Anion Gap: 5 — ABNORMAL LOW (ref 7–16)
Bilirubin,Total: 0.2 mg/dL (ref 0.2–1.0)
Calcium, Total: 9 mg/dL (ref 8.5–10.1)
Chloride: 104 mmol/L (ref 98–107)
Co2: 29 mmol/L (ref 21–32)
Potassium: 3.7 mmol/L (ref 3.5–5.1)
SGOT(AST): 24 U/L (ref 15–37)

## 2013-09-13 LAB — TROPONIN I: Troponin-I: 0.03 ng/mL

## 2013-09-14 ENCOUNTER — Telehealth: Payer: Self-pay | Admitting: Family Medicine

## 2013-09-14 LAB — URINALYSIS, COMPLETE
Bacteria: NONE SEEN
Bilirubin,UR: NEGATIVE
Blood: NEGATIVE
Glucose,UR: NEGATIVE mg/dL (ref 0–75)
Ketone: NEGATIVE
Nitrite: NEGATIVE
Ph: 5 (ref 4.5–8.0)
Specific Gravity: 1.02 (ref 1.003–1.030)
WBC UR: 2 /HPF (ref 0–5)

## 2013-09-14 LAB — BASIC METABOLIC PANEL
Anion Gap: 4 — ABNORMAL LOW (ref 7–16)
Co2: 27 mmol/L (ref 21–32)
EGFR (African American): >60
EGFR (Non-African Amer.): 53 — ABNORMAL LOW
Osmolality: 279 (ref 275–301)

## 2013-09-14 NOTE — Telephone Encounter (Signed)
Was sent to [facility] and admitted Will follow up upon return to [facility]

## 2013-09-14 NOTE — Telephone Encounter (Signed)
[facility] Triage Call Report Triage Record Num: [id number] Operator: Remonia Richter Patient Name: Lindsay Branch Call Date & Time: 09/13/2013 9:04:57PM Patient Phone: [phone] PCP: Tillman Abide Patient Gender: Female PCP Fax : [phone] Patient DOB: 07/16/1916 [facility] Name[facility] Reason for Call: Caller: Kathy/Lpn at [facility]; PCP: Tillman Abide [facility]); CB#: [phone]; Call regarding Cough with breathing difficulty and heart rate 130 this PM 09/13/13, sent out to ED at [facility] per facility policy and calling to notify office, note to office Protocol(s) Used: Office Note Recommended Outcome per Protocol: Information Noted and Sent to Office Reason for Outcome: Caller information to office Care Advice: ~ 09/13/2013 9:09:35PM Page 1 of 1 [email or web address]_[email or web address]

## 2013-09-15 LAB — URINE CULTURE

## 2013-09-18 LAB — CULTURE, BLOOD (SINGLE)

## 2013-09-21 ENCOUNTER — Telehealth: Payer: Self-pay | Admitting: Family Medicine

## 2013-09-21 DIAGNOSIS — J13 Pneumonia due to Streptococcus pneumoniae: Secondary | ICD-10-CM

## 2013-09-21 NOTE — Telephone Encounter (Signed)
[facility] Triage Call Report Triage Record Num: [id number] Operator: Claudie Leach Patient Name: Lindsay Branch Call Date & Time: 09/18/2013 10:26:41PM Patient Phone: [phone] PCP: Tillman Abide Caller Name: Burgess Estelle Relationship to Patient: Unknown Patient Gender: Female PCP Fax : [phone] Patient DOB: 1912/12/20 [facility] Name[facility] Reason for Call: Tom/RN calling and states patient was readmitted to facility after 5 day stay in the hospital and receiving Meropenem for pneumonia. Had first dose of Levaquin at 7 pm on 09/18/13 and states face started looking really red and body pink at 7:45 pm. Denies itching or any difficulty breathing. Vital signs are temp 97.6, pulse 80, resp 24 and blood pressure 154/84. O2 sat 95 on 2 liters [place]. Triaged per Allergic Reaction, Severe guideline. To call provider within 4 hours due to first occurrence of mild allergic reaction that occurs within minutes to several hours after exposure to an allergen and without other signs or symptoms of anaphylaxis. Care advice given. Notified Dr. Beverely Low and orders given to discontinue Levaquin, add Levaquin to allergies and to start Doxycycline 100 mg BID x 10 days. Caller made aware and agreed. Protocol(s) Used: Allergic Reaction, Severe Recommended Outcome per Protocol: Call Provider within 4 Hours Reason for Outcome: First occurrence of mild allergic reaction (rash; hives; itching; nasal congestion; watery, red eyes) that occurs within minutes to several hours after exposure to an allergen AND without any other signs or symptoms of anaphylaxis. Care Advice: ~ IMMEDIATE ACTION 09/18/2013 11:11:42PM Page 1 of 1 [email or web address]_[email or web address]_V2

## 2013-09-21 NOTE — Telephone Encounter (Signed)
Seen today Still weak  rash gone though On doxy now to finish course for pneumonia

## 2013-10-06 DIAGNOSIS — F5089 Other specified eating disorder: Secondary | ICD-10-CM

## 2013-10-06 DIAGNOSIS — F0150 Vascular dementia without behavioral disturbance: Secondary | ICD-10-CM

## 2013-10-06 DIAGNOSIS — K222 Esophageal obstruction: Secondary | ICD-10-CM

## 2013-10-06 DIAGNOSIS — I69998 Other sequelae following unspecified cerebrovascular disease: Secondary | ICD-10-CM

## 2013-12-03 DIAGNOSIS — K222 Esophageal obstruction: Secondary | ICD-10-CM

## 2013-12-03 DIAGNOSIS — I10 Essential (primary) hypertension: Secondary | ICD-10-CM

## 2013-12-03 DIAGNOSIS — F0150 Vascular dementia without behavioral disturbance: Secondary | ICD-10-CM

## 2013-12-03 DIAGNOSIS — I6990 Unspecified sequelae of unspecified cerebrovascular disease: Secondary | ICD-10-CM

## 2014-01-20 IMAGING — CT CT HEAD WITHOUT CONTRAST
2 series · 16 of 30 positions shown, 20 images · non-contrast
Comparison: none

REASON FOR EXAM: head trauma
COMMENTS:

[Series 2: soft tissue · axial · 0.43mm/px · z∈[-232,-187]mm · 3 of 33 slices shown]
[im 3/33  brain]
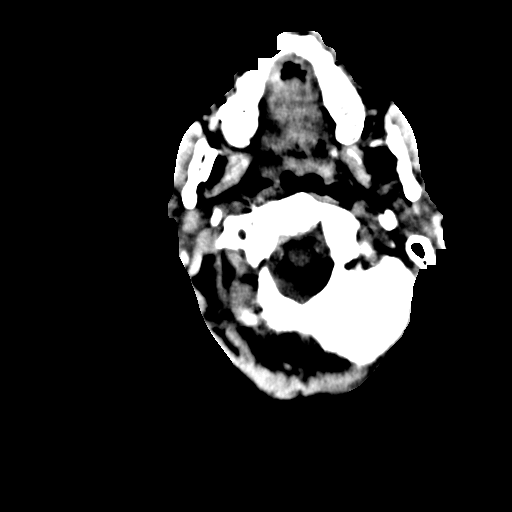
[im 7/33  brain]
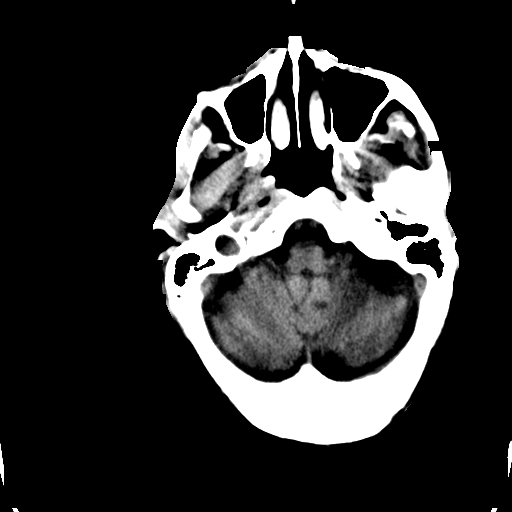
[im 12/33  brain]
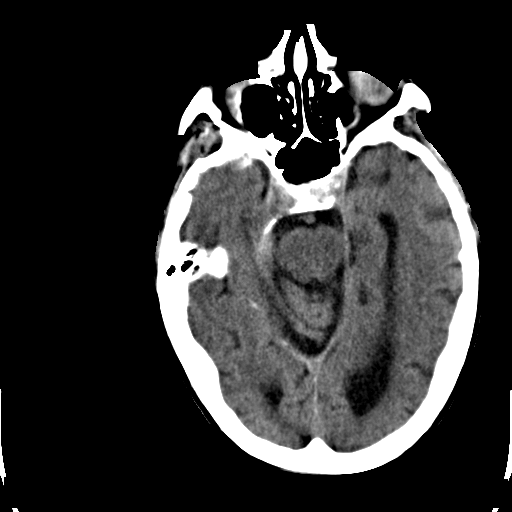

[Series 4: soft tissue recon · axial · 0.43mm/px · z∈[-209,-71]mm · 13 of 34 slices shown, 17 images]
[im 3/34  brain]
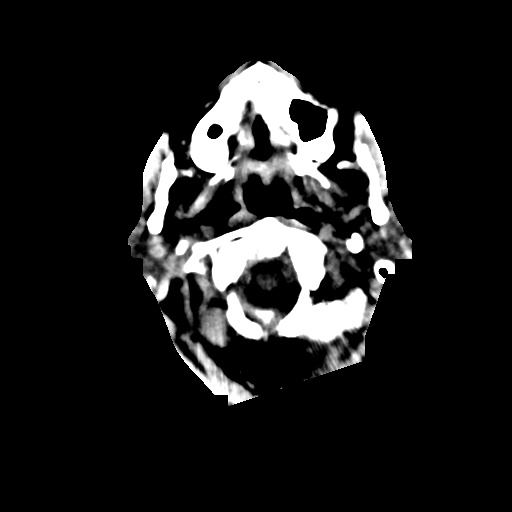
[im 3/34  bone]
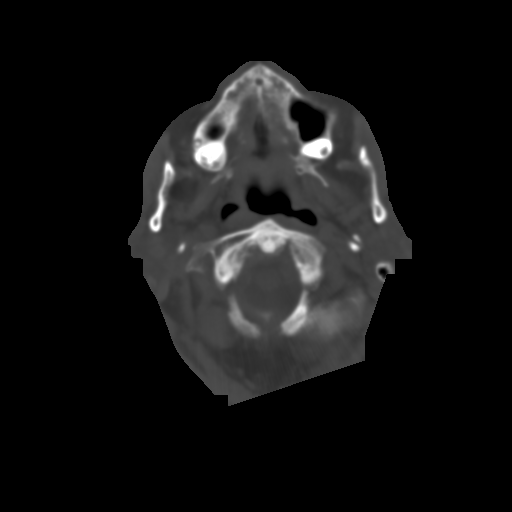
[im 5/34  brain]
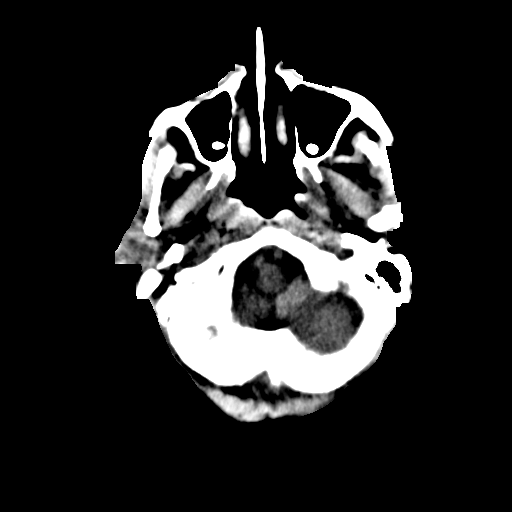
[im 8/34  brain]
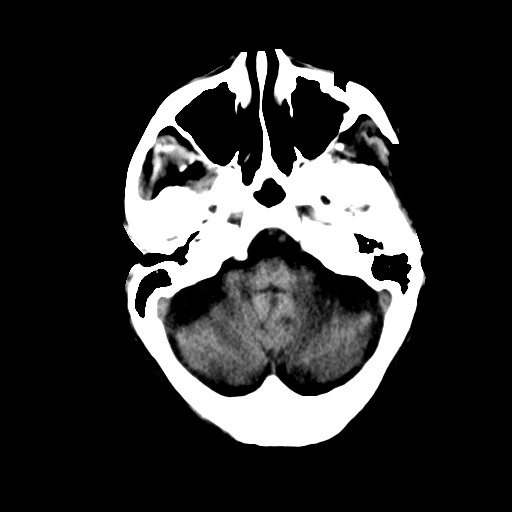
[im 10/34  brain]
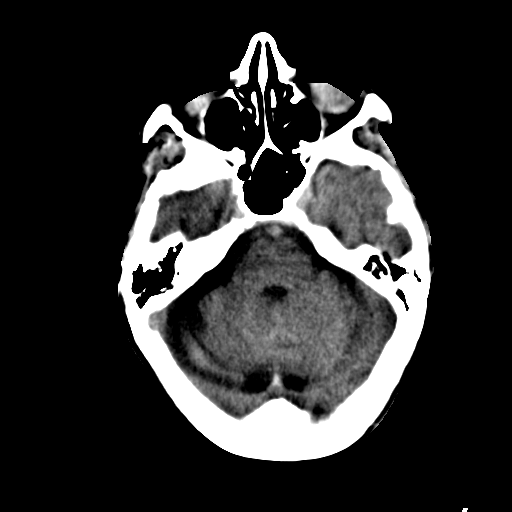
[im 12/34  brain]
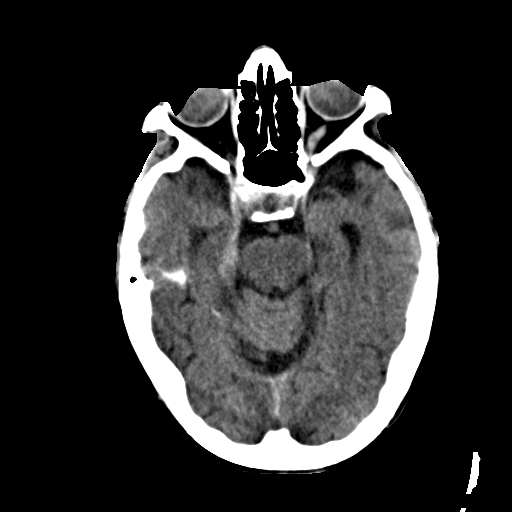
[im 12/34  bone]
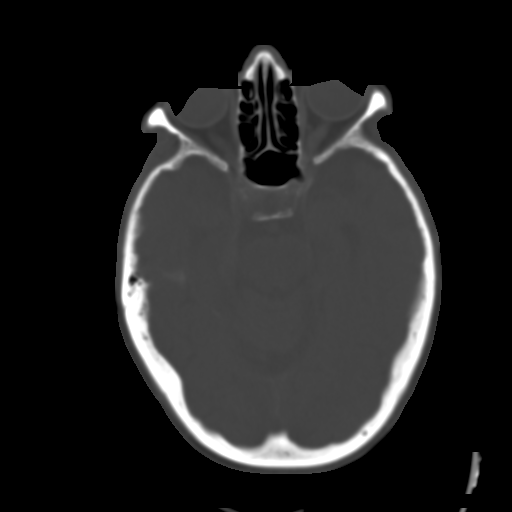
[im 15/34  brain]
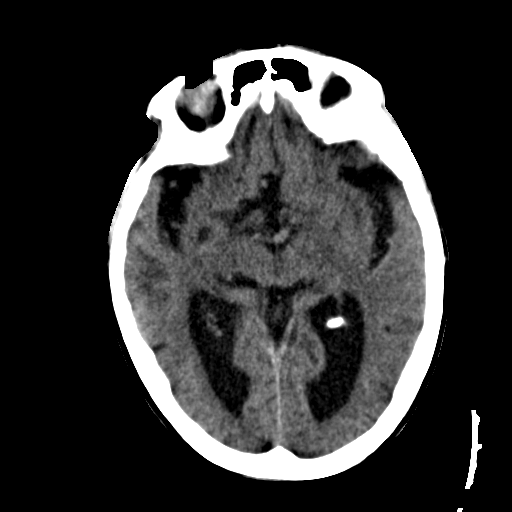
[im 17/34  brain]
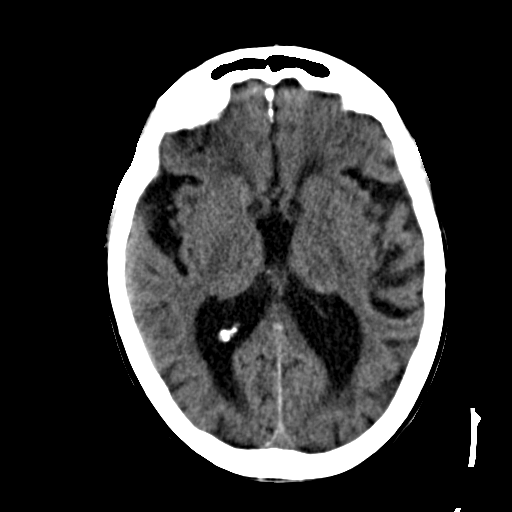
[im 19/34  brain]
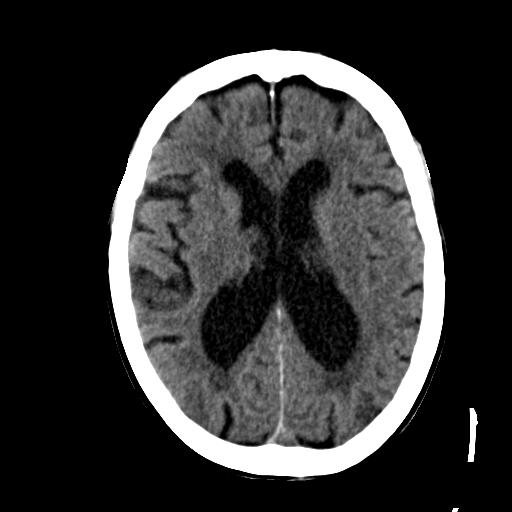
[im 22/34  brain]
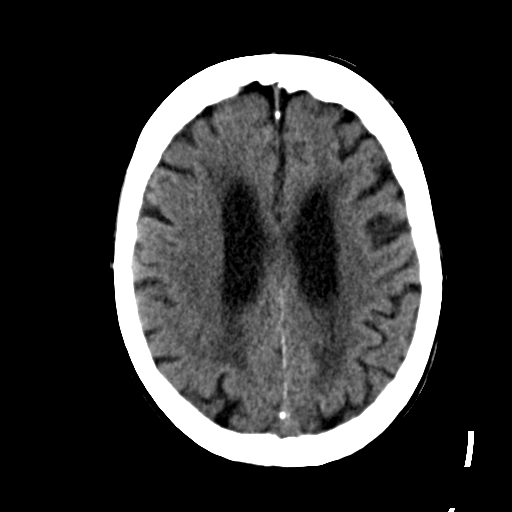
[im 22/34  bone]
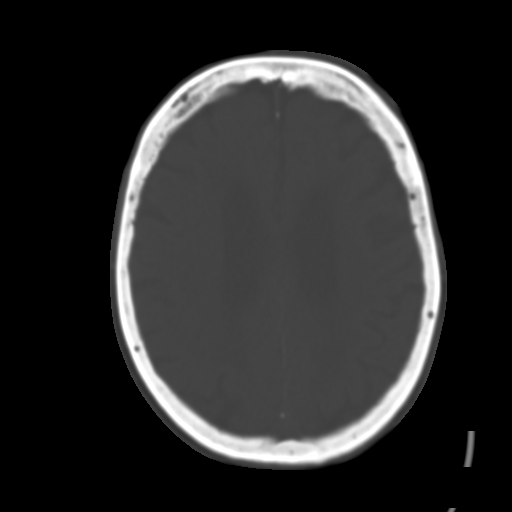
[im 24/34  brain]
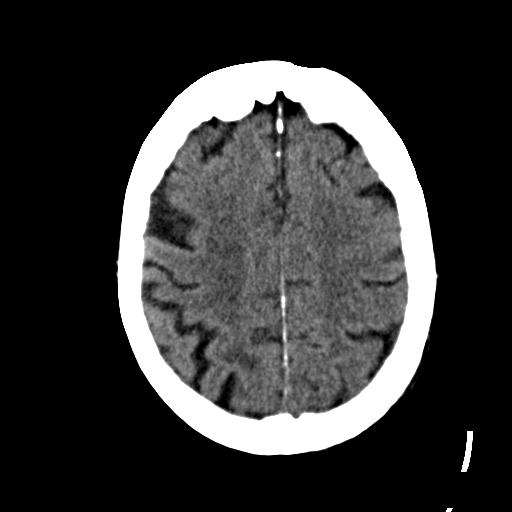
[im 26/34  brain]
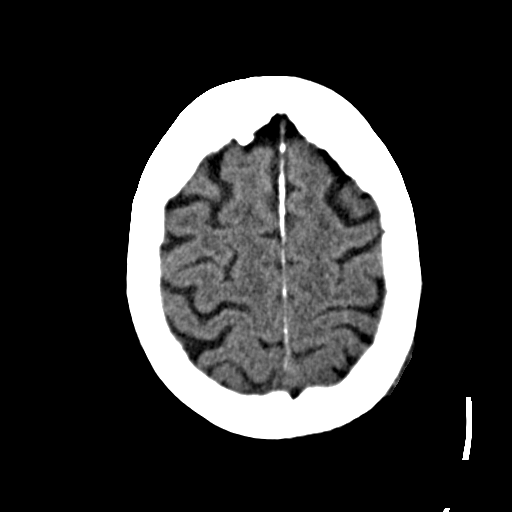
[im 29/34  brain]
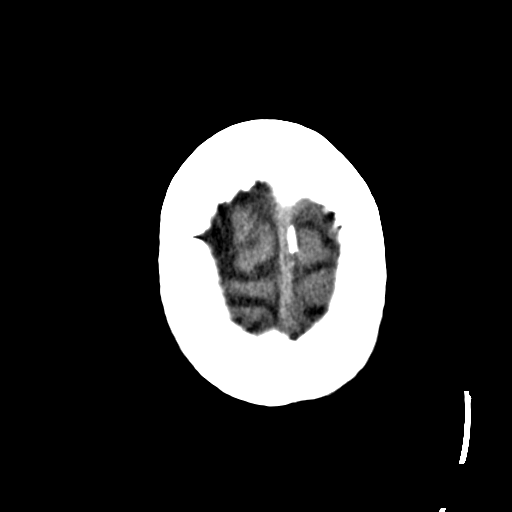
[im 31/34  brain]
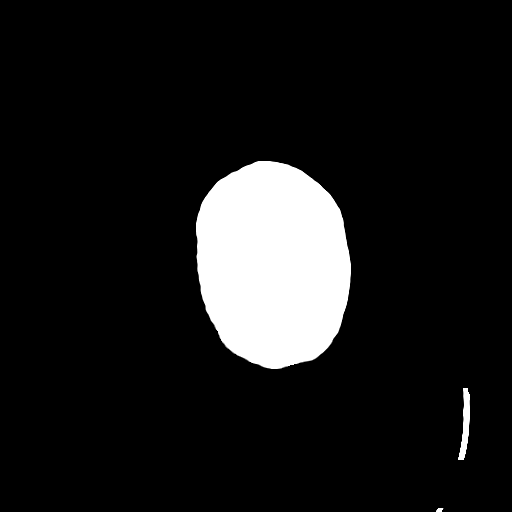
[im 31/34  bone]
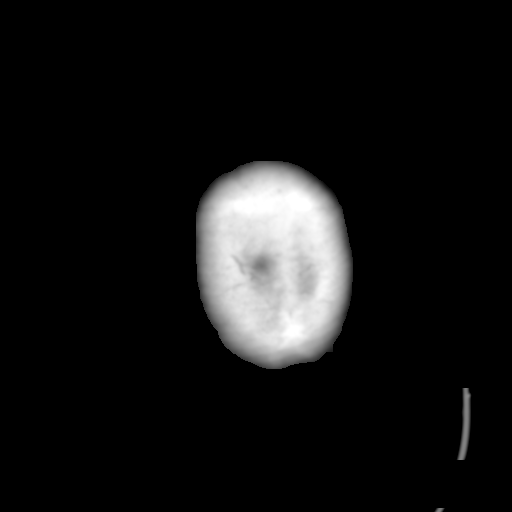

[16 of 30 positions shown; findings below may reference images not displayed]

PROCEDURE:     CT  - CT HEAD WITHOUT CONTRAST  - May 12, 2013  [DATE]

RESULT:     Noncontrast emergent CT of the brain is compared to the previous
exam of 03/01/2013.

There is a left parietal scalp hematoma. There is prominence of the
ventricles and sulci consistent with atrophy. Low-attenuation is present
diffusely in the periventricular and subcortical white matter regions most
consistent with chronic microvascular ischemic disease. There is no evidence
of intracranial hemorrhage, mass, mass effect or territorial infarct since
the previous study. The sinuses and mastoid air cells show normal appearing
aeration. No depressed skull fracture is evident.
IMPRESSION: 1. No acute intracranial abnormality. Changes of atrophy and chronic
microvascular ischemic disease are present. The a stable appearance.

[REDACTED]

## 2014-02-03 DIAGNOSIS — F0150 Vascular dementia without behavioral disturbance: Secondary | ICD-10-CM

## 2014-02-03 DIAGNOSIS — I69998 Other sequelae following unspecified cerebrovascular disease: Secondary | ICD-10-CM

## 2014-02-03 DIAGNOSIS — K222 Esophageal obstruction: Secondary | ICD-10-CM

## 2014-02-03 DIAGNOSIS — I10 Essential (primary) hypertension: Secondary | ICD-10-CM

## 2014-02-23 DIAGNOSIS — R55 Syncope and collapse: Secondary | ICD-10-CM

## 2014-03-10 DIAGNOSIS — R059 Cough, unspecified: Secondary | ICD-10-CM

## 2014-03-10 DIAGNOSIS — R05 Cough: Secondary | ICD-10-CM

## 2014-03-10 DIAGNOSIS — R070 Pain in throat: Secondary | ICD-10-CM

## 2014-04-13 DIAGNOSIS — K222 Esophageal obstruction: Secondary | ICD-10-CM

## 2014-04-13 DIAGNOSIS — I10 Essential (primary) hypertension: Secondary | ICD-10-CM

## 2014-04-13 DIAGNOSIS — F0150 Vascular dementia without behavioral disturbance: Secondary | ICD-10-CM

## 2014-04-13 DIAGNOSIS — I6990 Unspecified sequelae of unspecified cerebrovascular disease: Secondary | ICD-10-CM

## 2014-05-05 DIAGNOSIS — R0989 Other specified symptoms and signs involving the circulatory and respiratory systems: Secondary | ICD-10-CM

## 2014-05-05 DIAGNOSIS — R0609 Other forms of dyspnea: Secondary | ICD-10-CM

## 2014-06-09 DIAGNOSIS — K222 Esophageal obstruction: Secondary | ICD-10-CM

## 2014-06-09 DIAGNOSIS — I10 Essential (primary) hypertension: Secondary | ICD-10-CM

## 2014-06-09 DIAGNOSIS — I69998 Other sequelae following unspecified cerebrovascular disease: Secondary | ICD-10-CM

## 2014-06-09 DIAGNOSIS — R630 Anorexia: Secondary | ICD-10-CM

## 2014-06-09 DIAGNOSIS — F0150 Vascular dementia without behavioral disturbance: Secondary | ICD-10-CM

## 2014-08-12 DIAGNOSIS — F0150 Vascular dementia without behavioral disturbance: Secondary | ICD-10-CM

## 2014-08-12 DIAGNOSIS — I69398 Other sequelae of cerebral infarction: Secondary | ICD-10-CM

## 2014-08-12 DIAGNOSIS — K222 Esophageal obstruction: Secondary | ICD-10-CM

## 2014-08-12 DIAGNOSIS — I10 Essential (primary) hypertension: Secondary | ICD-10-CM

## 2014-09-11 ENCOUNTER — Emergency Department: Payer: Self-pay | Admitting: Emergency Medicine

## 2014-09-11 LAB — CBC
HCT: 41.8 % (ref 35.0–47.0)
HGB: 13.4 g/dL (ref 12.0–16.0)
MCH: 30.1 pg (ref 26.0–34.0)
MCHC: 32.1 g/dL (ref 32.0–36.0)
MCV: 94 fL (ref 80–100)
Platelet: 191 10*3/uL (ref 150–440)
RBC: 4.46 10*6/uL (ref 3.80–5.20)
RDW: 13.7 % (ref 11.5–14.5)
WBC: 8.5 10*3/uL (ref 3.6–11.0)

## 2014-09-11 LAB — COMPREHENSIVE METABOLIC PANEL
ALBUMIN: 3 g/dL — AB (ref 3.4–5.0)
ALK PHOS: 117 U/L — AB
Anion Gap: 6 — ABNORMAL LOW (ref 7–16)
BUN: 13 mg/dL (ref 7–18)
Bilirubin,Total: 0.3 mg/dL (ref 0.2–1.0)
CHLORIDE: 102 mmol/L (ref 98–107)
CO2: 33 mmol/L — AB (ref 21–32)
CREATININE: 0.97 mg/dL (ref 0.60–1.30)
Calcium, Total: 8.5 mg/dL (ref 8.5–10.1)
EGFR (African American): >60
GFR CALC NON AF AMER: 56 — AB
Glucose: 109 mg/dL — ABNORMAL HIGH (ref 65–99)
Osmolality: 282 (ref 275–301)
Potassium: 3.3 mmol/L — ABNORMAL LOW (ref 3.5–5.1)
SGOT(AST): 12 U/L — ABNORMAL LOW (ref 15–37)
SGPT (ALT): 13 U/L — ABNORMAL LOW
Sodium: 141 mmol/L (ref 136–145)
Total Protein: 6.2 g/dL — ABNORMAL LOW (ref 6.4–8.2)

## 2014-09-11 LAB — URINALYSIS, COMPLETE
Bilirubin,UR: NEGATIVE
Blood: NEGATIVE
GLUCOSE, UR: NEGATIVE mg/dL (ref 0–75)
Ketone: NEGATIVE
NITRITE: NEGATIVE
PH: 5 (ref 4.5–8.0)
Protein: NEGATIVE
RBC,UR: 2 /HPF (ref 0–5)
Specific Gravity: 1.017 (ref 1.003–1.030)
Squamous Epithelial: 15
WBC UR: 12 /HPF (ref 0–5)

## 2014-09-11 LAB — TROPONIN I: Troponin-I: 0.02 ng/mL

## 2014-09-12 LAB — URINE CULTURE

## 2014-09-21 DIAGNOSIS — J069 Acute upper respiratory infection, unspecified: Secondary | ICD-10-CM

## 2014-09-27 DIAGNOSIS — L03032 Cellulitis of left toe: Secondary | ICD-10-CM

## 2014-10-13 DIAGNOSIS — I69398 Other sequelae of cerebral infarction: Secondary | ICD-10-CM

## 2014-10-13 DIAGNOSIS — K219 Gastro-esophageal reflux disease without esophagitis: Secondary | ICD-10-CM

## 2014-10-13 DIAGNOSIS — F0150 Vascular dementia without behavioral disturbance: Secondary | ICD-10-CM

## 2014-10-13 DIAGNOSIS — R634 Abnormal weight loss: Secondary | ICD-10-CM

## 2014-10-13 DIAGNOSIS — I10 Essential (primary) hypertension: Secondary | ICD-10-CM

## 2014-10-13 DIAGNOSIS — K222 Esophageal obstruction: Secondary | ICD-10-CM

## 2014-11-08 ENCOUNTER — Telehealth: Payer: Self-pay

## 2014-11-08 NOTE — Telephone Encounter (Signed)
Yes, gave instructions and will see her tomorrow if needed

## 2014-11-08 NOTE — Telephone Encounter (Signed)
[facility] and she said she has already spoken with Dr Alphonsus SiasLetvak.

## 2014-11-08 NOTE — Telephone Encounter (Signed)
PLEASE NOTE: All timestamps contained within this report are represented as [place] Standard Time. CONFIDENTIALTY NOTICE: This fax transmission is intended only for the addressee. It contains information that is legally privileged, confidential or otherwise protected from use or disclosure. If you are not the intended recipient, you are strictly prohibited from reviewing, disclosing, copying using or disseminating any of this information or taking any action in reliance on or regarding this information. If you have received this fax in error, please notify us immediately by telephone so that we can arrange for its return to us. Phone: [phone], Toll-Free: [phone], Fax: [phone] Page: 1 of 1 Call Id: [id number] [facility] Client TELEPHONE ADVICE RECORD [facility] Patient Name: Lindsay Branch Gender: Female DOB: January 22, 1913 Age: 81 Y 3 M 23 D Return Phone Number: Address: City/State/Zip: [place] StatisticianClient [facility] Day Physician Tillman AbideLetvak, Richard Contact Type Call Caller Name Merlinda Frederickshley Caller Phone Number [phone] Relationship To Patient Provider Is this call to report lab results? No Call Type General Information Initial Comment Morrie [facility] speech garbled, very [phone], transferred to main office number General Information Type Other Nurse Assessment Guidelines Guideline Title Affirmed Question Affirmed Notes Nurse Date/Time (Eastern Time) Disp. Time Lamount Cohen(Eastern Time) Disposition Final User 11/08/2014 4:19:41 PM General Information Provided Yes Claudina LickNorton, Lori After Care Instructions Given Call Event Type User Date / Time Description

## 2014-12-02 DIAGNOSIS — I69398 Other sequelae of cerebral infarction: Secondary | ICD-10-CM | POA: Diagnosis not present

## 2014-12-02 DIAGNOSIS — F0150 Vascular dementia without behavioral disturbance: Secondary | ICD-10-CM

## 2014-12-02 DIAGNOSIS — E441 Mild protein-calorie malnutrition: Secondary | ICD-10-CM | POA: Diagnosis not present

## 2014-12-02 DIAGNOSIS — K222 Esophageal obstruction: Secondary | ICD-10-CM | POA: Diagnosis not present

## 2014-12-02 DIAGNOSIS — I10 Essential (primary) hypertension: Secondary | ICD-10-CM | POA: Diagnosis not present

## 2015-01-18 NOTE — H&P (Signed)
PATIENT NAME:  Lindsay Branch, Lindsay Branch MR#:  [id number] DATE OF BIRTH:  [birth year]  DATE OF ADMISSION:  05/02/2012  CHIEF COMPLAINT: Altered mental status.   HISTORY OF PRESENT ILLNESS: This is a 81 year old female who lives in assisted living at [facility]. Her neighbors have been noticing she has been more irritable lately, not eating and drinking. Today she was confused. They brought her to the hospital where she was found to be dehydrated and to have some mild confusion. The neighbors tell me that she has had some trouble with some dental work recently and sees Dr. Tiburcio Pea who is a Education officer, community, and saw him about two weeks ago. He has been on vacation this week so she has not been able to get back in. Lindsay Branch does not complain of any mouth pain but her neighbors feel like that may have been partially the cause for decreased oral intake, although they said she has had this repeatedly before. She has not made any urine since being here in the Emergency Room and the neighbors say they don't feel she has been making this very much as an outpatient.   PAST MEDICAL HISTORY:  1. Hypertension.  2. Esophageal stricture status post multiple dilatations.  3. Macular degeneration in both eyes.  4. History of transient ischemic attack.  5. History of admissions for dehydration.   PAST SURGICAL HISTORY:  1. Cholecystectomy. 2. Ovarian resection.  3. Hysterectomy.   ALLERGIES: Tramadol and Carafate.   CURRENT MEDICATIONS:  1. Altace 5 mg daily.  2. Aspirin 81 mg daily.  3. Remeron 15 mg at bedtime.  4. Latanoprost 0.005% ophthalmic solution, one drop in each eye at bedtime.   SOCIAL HISTORY:  Lives at [facility] Does not smoke or drink alcohol.   FAMILY HISTORY: Unable to obtain from the patient.  REVIEW OF SYSTEMS: Unable to obtain accurately from the patient because of her altered mental status.   PHYSICAL EXAMINATION:  VITAL SIGNS: Temperature 98, pulse 69, respirations 18, blood pressure  197/119.   GENERAL: This is a poorly nourished white female in no acute distress.   HEENT: The pupils are equal, round, and reactive to light. Sclerae anicteric. Oral mucosa is dry.  Oropharynx is clear.  Nasopharynx is clear.   NECK: Supple. No JVD, lymphadenopathy, or thyromegaly.   CARDIOVASCULAR: Regular rate and rhythm. There is a 2/6 systolic murmur.   LUNGS: Clear to auscultation. No dullness to percussion. She is not using accessory muscles.   ABDOMEN: Soft, nontender, nondistended. Bowel sounds are positive. There was no hepatosplenomegaly. No masses.   EXTREMITIES: There is 1+ lower extremity edema. No joint deformity.   NEUROLOGIC: Cranial nerves II through XII appear to be intact. She answer some questions appropriately but is confused in other aspects. There is no decrease in sensation bilaterally in the upper or lower extremities. She moves all extremities. Strength is 5/5 in the upper and lower extremities.   SKIN: Poor turgor but dry to touch. No rash.   LABORATORY, DIAGNOSTIC, AND RADIOLOGICAL DATA: BUN 18, creatinine 0.8, sodium 143, potassium 4.1. White blood cells are 7.5, hemoglobin 13.1, magnesium 1.7.   ASSESSMENT AND PLAN:  1. Dehydration: Suspect this is poor p.o. intake. She does have some mild prerenal azotemia on her labs but no renal failure. Dehydration could be from issues with her dental work. Cannot rule out urinary tract infection affecting this at this point because we cannot get a urine on her. We will go ahead and admit her and  gently hydrate her overnight and reassess her in the morning. She had had repeated episodes of this even before she was having dental problems, so I have to wonder if she has had appropriate level of care at her age and we will get case management involved with that assessment. We will also have dietary see her and see if we need to adjust diet for better p.o. intake.  2. Altered mental status, probably some mild metabolic  encephalopathy from dehydration: Again, cannot rule out urinary tract infection at this point, but I do not feel compelled to give antibiotics without further proof. We will reassess this as she begins to get hydrated.  3. Oliguria. She has made no urine here despite having a catheter. She does have some prerenal azotemia. This is probably all dehydration. We will watch this closely as we hydrate her.  4. Hypertension. We will restart her medications and assess as we monitor here and adjust as necessary.  5. CODE STATUS: She was a FULL CODE last time here. The neighbors that have health care power of attorney  do confirm that she wants to be a FULL CODE as does Lindsay Branch, but with her mental status I want it confirmed by others, and her power of attorney has confirmed that she is a FULL CODE.  TIME SPENT ON ADMISSION: 60 minutes.  ____________________________ Gracelyn NurseJohn D. Johnston, MD [email or web address] D: 05/02/2012 13:41:21 ET T: 05/02/2012 14:09:24 ET JOB#: [id number]  cc: Gracelyn NurseJohn D. Johnston, MD, <Dictator> Karie Schwalbeichard I. Letvak, MD Gracelyn NurseJOHN D JOHNSTON MD ELECTRONICALLY SIGNED 05/02/2012 21:08

## 2015-01-18 NOTE — Discharge Summary (Signed)
PATIENT NAME:  Lindsay Branch, Lindsay Branch MR#:  [id number] DATE OF BIRTH:  1913-09-12  DATE OF ADMISSION:  05/02/2012 DATE OF DISCHARGE:  05/05/2012  ADMITTING PHYSICIAN: Marcelino Duster, MD  DISCHARGING PHYSICIAN: Enid Baas, MD  PRIMARY CARE PHYSICIAN: None.   CONSULTANTS: None.   DISCHARGE DIAGNOSES:  1. Metabolic encephalopathy.  2. Dehydration.  3. Hypertension.  4. Constipation.  5. Dementia.   DISCHARGE HOME MEDICATIONS:  1. Aspirin 81 mg p.o. daily.  2. Remeron 15 mg p.o. at bedtime.  3. Latanoprost 0.005% ophthalmic solution one drop to both eyes once a day at bedtime.  4. Vitamin D3 50,000 international units once a month orderly.  5. Prilosec 20 mg p.o. daily.  6. Tylenol 650 mg p.o. every four hours p.r.n. for pain or fever.  7. Altace 10 mg p.o. daily.  8. Milk of Magnesia 30 mL suspension p.r.n. daily for constipation.   DISCHARGE HOME OXYGEN: None.   DISCHARGE DIET: Mechanical soft, low sodium diet with Ensure three times daily with meals.   DISCHARGE ACTIVITY: As tolerated.  FOLLOWUP INSTRUCTIONS: Primary care physician followup in 1 to 2 weeks. Physical therapy.   LABS AND IMAGING STUDIES: Serum magnesium 2.2, sodium 145, potassium 3.7, chloride 109, bicarbonate 28, BUN 13, creatinine 0.67, glucose 82, and calcium 8.4.  Right hip x-ray is showing no acute fracture seen.   Urinalysis is negative for any infection.   Urine culture is growing only 1000 colonies of gram-negative rods.   WBC is 7.5, hematocrit 13.1, hematocrit 38.8, and platelet count 200. INR 0.9. Troponin is negative. ALT 16, AST 25, alkaline phosphatase 106, total bilirubin 0.3, and albumin 3.4.   Chest x-ray on admission is showing minimal right lung base atelectasis with stable cardiomegaly and pulmonary vascular congestion noted.   CT of the head without contrast is showing chronic changes without any evidence of acute ischemic or hemorrhagic infarct.   BRIEF HOSPITAL COURSE:  1. Ms.  Macknight is a 81 year old elderly Caucasian female who lives at [facility] assisted living facility with history of dementia, hypertension, and history of esophageal stricture status post multiple dilatations who was brought in for decreased oral intake and also change in mental status from baseline. The patient does not have any family members and her neighbors, wife and husband, are health care power of attorneys. According to them, she does this whenever she is dehydrated by poor oral intake. There is no evidence of any infection. Urine was negative for any infection. She was started on IV fluids. Her p.o. intake has improved and she is currently alert and at baseline is confused from her dementia and answers simple questions sometimes. She is not been on antibiotics for the same.  2. Hypertension. She was on Altace 5 mg here in the hospital. The dose has been increased to 10 mg because of elevated blood pressures in the hospital. She worked with physical therapy who said that she is not safe to go back to her assisted living facility and would need some rehab or skilled nursing, so she is being discharged to [facility] short-term rehab. Her course has been otherwise uneventful in the hospital. She was constipated for which Lactulose and Dulcolax suppository were ordered.   DISCHARGE CONDITION: Stable.   DISCHARGE DISPOSITION: To [facility] short-term rehab.   CODE STATUS: FULL CODE as discussed with the patient's healthcare power of attorneys on admission and also at discharge.   TIME SPENT ON DISCHARGE: 45 minutes. ____________________________ Enid Baas, MD rk:slb D: 05/05/2012  13:47:36 ET T: 05/05/2012 14:02:40 ET JOB#: [id number]  cc: Enid Baasadhika Alexsandra Shontz, MD, <Dictator> Enid BaasADHIKA Eve Rey MD ELECTRONICALLY SIGNED 05/05/2012 15:11

## 2015-01-21 NOTE — H&P (Signed)
PATIENT NAME:  Lindsay Branch, Lindsay Branch MR#:  [id number] DATE OF BIRTH:  03-Feb-1913  DATE OF ADMISSION:  03/01/2013  ATTENDING PHYSICIAN:  Dr. Chiquita LothJade Sung.   PRIMARY CARE PHYSICIAN: Dr. Vonita MossMark Crissman.  CHIEF COMPLAINT: Altered mental status.   Lindsay Branch is a 81 year old white female who is well-oriented at baseline and walks with the help of a walker. Lives in a skilled nursing facility. Was brought to the Emergency Department by her POEA for altered mental status. The patient is a resident of [facility]. Per POEA the patient had a fecal impaction about one week back. At that time, the patient had decreased p.o. intake. The patient was given laxatives,  with improvement of the bowel movements. However, the patient continued to have decreased p.o. intake. There was a concern about dehydration. Considering this, the patient was brought to the Emergency Department. Per family members, the patient does not have any fever. No signs of any cough. Workup in the Emergency Department, chest x-ray did not reveal any obvious infiltration. CT head without contrast was unremarkable. Urinalysis showed multiple epithelial cells with mild leukocyte esterase. The patient received a 1 liter IV fluid bolus in the Emergency Department with improvement of the mental status.   PAST MEDICAL HISTORY: 1.  Hypertension.  2.  Esophageal strictures, status post multiple dilatations. 3.  Macular degeneration of both eyes.  4.  History of TIA.  5.  History of dehydration.   PAST SURGICAL HISTORY: 1.  Cholecystectomy.  2.  Oophorectomy. 3.  Hysterectomy.  4.  [facility].   ALLERGIES: To TRAMADOL and CARAFATE.   HOME MEDICATIONS: 1.  Xalatan 0.005, 1 drop to the affected eye.   2.  Vitamin D3 50,000 units once a month.  3.  Systane 1 drop to the affected eye.  4.  Remeron 15 mg 1 tablet once a day.  5.  Prilosec 20 mg once a day.  6.  Mirtazapine 15 mg 1 tablet once a day.  7.  Milk of magnesia 8%, 30 mL  once a day.  8.  Kaopectate 30 mL times a day.  9.  Aspirin 81 mg once a day.  10.  Altace 10 mg daily.  11.  Acetaminophen 650 mg every 4 hours.   SOCIAL HISTORY: No history of smoking, drinking, alcohol or using illicit drugs. As mentioned above, the patient is a resident of the [facility] skilled nursing facility unit.   FAMILY HISTORY: Could not be obtained from the patient as the patient has altered mental status.   REVIEW OF SYSTEMS: Could not be obtained from the patient as the patient is currently confused.   PHYSICAL EXAMINATION: GENERAL: This is a well-built, well-nourished, age-appropriate female laying down in the bed, not in distress.  VITAL SIGNS: Temperature 98.4, pulse 64, blood pressure 146/68, respiratory rate of 18, oxygen saturation 100% on room air.  HEENT: Normocephalic, atraumatic. No scleral icterus. Conjunctivae normal. Could not examine the extraocular movements. Pupils equal and reactive to light. Mucous membranes: Mild dryness. Could not examine the oropharynx.  NECK: Supple. No lymphadenopathy. No JVD. No carotid bruit. No thyromegaly.  CHEST: Has no focal tenderness.  LUNGS: Bilaterally clear to auscultation, however somewhat decreased breath sounds in the lower lobes.    HEART: S1 and S2 regular. No murmurs are heard.  ABDOMEN: Bowel sounds present. Soft, nontender, nondistended. No hepatosplenomegaly.  EXTREMITIES: No pedal edema. Pulses 2+. Could not examine the range of motion.  SKIN: No rash or lesions.  LYMPHATIC: No axillary or inguinal lymphadenopathy.  NEUROLOGIC: The patient is oriented to self, but not to place and time. No apparent cranial nerve abnormalities. Moving all four extremities.   LABS: UA 1+ leukocyte esterase.   CMP: Sodium 123, potassium of 5.5. The rest of all the values are within normal limits. Cardiac enzymes are well within normal limits.   CBC: WBC of 8.7, hemoglobin 12, platelet count of 247.   CT head without contrast:  No acute intracranial abnormality.   Chest x-ray, PA and lateral: No acute cardiothoracic or cardiopulmonary disease.   ASSESSMENT AND PLAN: Lindsay Tineo is a 81 year old female who comes to the Emergency Department with altered mental status:   1.  Altered mental status: This could be secondary to hyponatremia, and the patient is also on mirtazapine, Remeron. Also concerned about the patient may have underlying infection from pneumonia. Continue with IV fluids. Hold the sedating medications. Will also treat with Levaquin and follow up.  2.  Hyponatremia: Will check the urine sodium, urine osmolality, serum osmolality. Highly concerning about hypovolemic hyponatremia. Continue with IV fluids and follow up.  3.  Hyperkalemia, with a potassium of 5.5. EKG does not show peaked T waves. After hydrating the patient, will repeat the potassium levels.  4.  Debility: Will involve in physical therapy, occupational therapy.  5.  Keep the patient on DVT prophylaxis with Lovenox.  6. Goals of care discussed with the patient's neighbor who is also medical power. Has already stated that the patient mentioned multiple times that wanted her to revive initially in case of her cardiopulmonary arrest.   Time spent: Forty-five minutes.   ____________________________ Susa Griffins, MD pv:dm D: 03/01/2013 06:13:00 ET T: 03/01/2013 08:09:31 ET JOB#: [id number]  cc: Susa Griffins, MD, <Dictator> Steele Sizer, MD Susa Griffins MD ELECTRONICALLY SIGNED 03/02/2013 0:55

## 2015-01-21 NOTE — Discharge Summary (Signed)
PATIENT NAME:  Lindsay Branch, Lindsay Branch MR#:  [id number] DATE OF BIRTH:  May 19, 1913  DATE OF ADMISSION:  09/13/2013 DATE OF DISCHARGE:  09/18/2013  PRIMARY CARE PHYSICIAN: Listed as Dr. Dossie Arbour.   FINAL DIAGNOSES:  1.  Clinical sepsis with pneumonia.  2.  Acute respiratory failure.  3.  Dementia.  4.  Glaucoma.  5.  Hypertension.   MEDICATIONS ON DISCHARGE: Include Sustain ultra ophthalmic solution 1 drop to each eye twice a day, Ensure clear 1 carton by mouth 3 times a day, omeprazole 20 mg daily, vitamin D2 50,000 units once a month on the 16th, Remeron 15 mg 0.5 tab, which is 7.5 mg orally once a day at bedtime, aspirin 81 mg daily, latanoprost ophthalmic solution 0.005% 1 drop to affected eye once a day, prednisone 5 mg 3 tablets day 1, 2 tablets day 2 and 3, 1 tablet days 4 and 5, then stop, albuterol ipratropium nebulizer 3 mL inhalation 4 times a day for 6 days, amlodipine 2.5 mg daily, levofloxacin 750 mg orally every 48 hours for 6 days at 6:00 p.m. starting on 09/18/2013, oxygen 2 L nasal cannula with portable tank; can continue this for pulse oximetry less than 88%.   DIET: Low-sodium diet, Ensure clear 3 times a day, puree, thin liquids, strict aspiration precautions and reflux precautions, meds in puree crushed as necessary, tray set up and assistance at meals.   ACTIVITY: As tolerated.   FOLLOWUP: With physical therapy 1 to 2 days with doctor at the skilled facility.   HOSPITAL COURSE: The patient was admitted December 14 and discharged December 19. He was brought in for clinical sepsis and pneumonia.   LABORATORY AND RADIOLOGICAL DATA DURING THE HOSPITAL COURSE: Included blood cultures that were negative. TSH 2.67. Troponin negative. Glucose 110, BUN 20, creatinine 0.99, sodium 138, potassium 3.7, chloride 104, CO2 29, calcium 9.0. Liver function tests normal range. White blood cell count 7.7, hemoglobin and hematocrit 13.6 and 42.2, platelet count 164. EKG: First-degree AV block,  left atrial enlargement. Chest x-ray: Large hiatal hernia, atelectasis versus consolidation at the lung base, enlargement of the cardiac silhouette, Urine culture negative. Urinalysis negative. Sputum culture negative. Pulse oximetry on room air 87% and that was done on December 18.   HOSPITAL COURSE PER PROBLEM LIST:  1.  For the patient's clinical sepsis and pneumonia, the patient had improved. Vital signs were stable upon discharge. I was very cautious with her because of her age being 81 years old and from a facility. She was started on aggressive antibiotics on presentation of vancomycin, Zosyn and Levaquin. Vancomycin was DC'd because of cultures being negative. Meropenem and Levaquin were continued during the entire hospital stay and will be flipped over just to Levaquin upon discharge; dosed secondary to kidney function every 48 hours.  2.  Acute respiratory failure. Pulse oximetry just borderline 87% on room air. Continue oxygen supplementation to keep pulse oximetry 90%. Can taper this as outpatient.  3.  Dementia. The patient does answer questions appropriately. She was a little confused upon presentation.  4.  Glaucoma. Continue latanoprost.  5.  Hypertension. Blood pressure did go up, likely secondary to the steroids that we had the patient on. We will have a quick prednisone taper. Norvasc 2.5 mg started daily. Continue to monitor blood pressure as outpatient.   TIME SPENT ON DISCHARGE: 35 minutes.   CLINICAL STATUS: The patient has improved. Lungs clear upon discharge, but she still has a deep cough. Close clinical monitoring at the skilled  nursing still necessary.  ____________________________ Herschell Dimesichard J. Renae GlossWieting, MD rjw:aw D: 09/18/2013 09:45:04 ET T: 09/18/2013 10:07:47 ET JOB#: [id number]  cc: Herschell Dimesichard J. Renae GlossWieting, MD, <Dictator> Steele SizerMark A. Crissman, MD [facility] Salley ScarletICHARD J Elif Yonts MD ELECTRONICALLY SIGNED 09/18/2013 17:02

## 2015-01-21 NOTE — Discharge Summary (Signed)
PATIENT NAME:  Lindsay Branch, Lindsay Branch MR#:  [id number] DATE OF BIRTH:  16-Apr-1913  DATE OF ADMISSION:  03/01/2013 DATE OF DISCHARGE:  03/03/2013  PRIMARY CARE PHYSICIAN:  Tillman Abideichard Letvak, MD  PRESENTING COMPLAINT: Confusion/altered mental status.  DISCHARGE DIAGNOSES: 1.  Dehydration due to poor p.o. intake.  2.  Hypovolemia, hyponatremia improved with IV fluids.  3.  Urinary tract infection.  4.  Acute altered mental status/encephalopathy secondary to dehydration and urinary tract infection.  5.  Suspect early cognitive decline versus early dementia.  6.  Hypertension.  7.  Hyperkalemia, resolved.   CODE STATUS: FULL CODE.   ACTIVITY:  Physical therapy.   DIET: Regular diet. Magic cup Branch.i.d.  Ensure t.i.d. with meals.  DISCHARGE INSTRUCTIONS: With Dr. Tillman Abideichard Letvak in 10 days.  Encourage p.o. fluids.   DISCHARGE MEDICATIONS: 1.  Keflex 250 mg p.o. t.i.d. for 3 more days.  2.  Remeron 15 mg at bedtime.  3.  Latanoprost 0.005 ophthalmic drops 1 drop both eyes at bedtime.  4.  Systane preservative-free 1 drop to both eyes Branch.i.d.  5.  Prilosec 20 mg daily.  6.  Aspirin 81 mg daily.  7.  Ramipril 10 mg daily.  8.  Milk of Magnesia 30 mL daily p.r.n. constipation.   LABORATORY AND DIAGNOSTICS: Sodium 133, potassium 4.7, BUN 16 and creatinine 6.4. Cardiac enzymes x 3 negative. Blood cultures negative in 48 hours. UA positive for UTI.  Urine culture: 40,000 gram-negative rods.   Chest x-ray:  No acute cardiopulmonary abnormality. CT of the head is essentially negative for any acute abnormality. Atrophy with chronic microvascular ischemic changes.   BRIEF SUMMARY OF HOSPITAL COURSE:  1.  Lindsay Branch is a 81 year old Caucasian female who is a resident at [facility] comes in with altered mental status. She was admitted with altered mental status/mild encephalopathy, which was suspected secondary to hyponatremia and underlying infection from UTI.  She received IV fluids during the hospital  stay. Her mentation improved some; however, she continued to have some pleasant confusion. Her urine culture grew gram-negative rods. She was on Rocephin and changed to p.o. Keflex. The patient remained afebrile. White count was stable.  2.  Hypovolemia, hyponatremia due to poor p.o. intake. Sodium was 127 on admission. Came up to 133 after IV fluids and p.o. hydration. Encourage p.o. fluids.  3.  Hypokalemia, resolved with IV fluids.  4.  Debility, generalized weakness. Physical therapy recommends rehab. The patient will be discharged to [facility] with [facility]. She remained a FULL CODE. Discharge plan was discussed with the patient's family members, Lindsay Branch and Lindsay Branch.  TIME SPENT: 40 minutes.  ____________________________ Wylie HailSona A. Allena KatzPatel, MD sap:sb D: 03/03/2013 10:26:22 ET T: 03/03/2013 11:45:56 ET JOB#: [id number]  cc: Lurline Caver A. Allena KatzPatel, MD, <Dictator> Karie Schwalbeichard I. Letvak, MD Willow OraSONA A Dessiree Sze MD ELECTRONICALLY SIGNED 03/05/2013 19:39

## 2015-01-23 NOTE — Discharge Summary (Signed)
PATIENT NAME:  Lindsay Branch, Lindsay Branch MR#:  [id number] DATE OF BIRTH:  02/20/1913  DATE OF ADMISSION:  10/26/2011 DATE OF DISCHARGE:  10/28/2011  PRESENTING COMPLAINT: Decreased responsiveness.   DISCHARGE DIAGNOSES:  1. Transient altered mental status, resolved, suspected due to mild dehydration, improved.  2. Mild dehydration, resolved with IV fluids.  3. Hypertension.  4. Macular degeneration.   CONDITION ON DISCHARGE: Fair.   PRIMARY CARE PHYSICIAN: Vonita MossMark Crissman, MD    MEDICATIONS:  1. Altace 5 mg daily.  2. Aspirin 81 mg daily.  3. Remeron 15 mg at bedtime.  4. Latanoprost 0.005% ophthalmic solution one drop both eyes daily.  5. MiraLAX 17 grams in 8 ounces of water daily at bedtime.  6. Vitamin D3 50,000 international units 1 capsule p.o. once a month in the evening.  7. Ventolin HFA 90 mcg/inhalation 2 puffs 3 times a day as needed.  8. Artificial tears one drop into eye as needed for irritation.   DIET: Regular.   ACTIVITY: Home physical therapy.   FOLLOW-UP: Follow-up with Dr. Dossie Branch in 1 to 2 weeks.   CODE STATUS: FULL CODE.   LABS ON DISCHARGE: CBC within normal limits. Urinalysis negative for urinary tract infection. CT of the head is consistent with diffuse cerebral and cerebellar atrophy as well as chronic white matter ischemia. Mild soft tissue swelling noted over the left frontal region. This could also represent a skin lesion. Chest x-ray stable cardiomegaly. CBC within normal limits. Comprehensive metabolic panel within normal limits. EKG shows sinus rhythm with PACs, incomplete bundle branch block.   BRIEF SUMMARY OF HOSPITAL COURSE: [name] is a very pleasant 81 year old Caucasian female who came in with:  1. Transient decreased mental status. Etiology remains unclear, however, it could be possibly due to mainly dehydration. The patient did not have any symptoms of CVA. No evidence of infection was noted. She received IV fluids, was able to tolerate p.o.  liquids, hence, they were discontinued. The patient appeared to be euvolemic prior to discharge. Mentation was back to normal in several hours. CT of the head showed cerebral and cerebellar atrophy.  2. Generalized weakness with fall about two weeks ago. Physical therapy recommended home physical therapy which has been arranged prior to discharge.  3. Hypertension. Home meds were continued.  4. History of transient ischemic attack. The patient is on aspirin.  5. Macular degeneration. Eyedrops were continued.   DISPOSITION: The patient was discharged to independent living home with physical therapy. Discharge plan was discussed with the patient's HPOA.   TIME SPENT: 40 minutes.   ____________________________ Lindsay HailSona A. Allena KatzPatel, MD sap:drc D: 11/03/2011 15:55:51 ET T: 11/05/2011 13:12:48 ET JOB#: [id number]  cc: Lindsay Azimi A. Allena KatzPatel, MD, <Dictator> Lindsay SizerMark A. Crissman, MD Lindsay OraSONA A Lindsay Rossa MD ELECTRONICALLY SIGNED 11/08/2011 7:40

## 2015-01-23 NOTE — H&P (Signed)
PATIENT NAME:  Lindsay Branch, Lindsay Branch MR#:  [id number] DATE OF BIRTH:  05/24/1913  DATE OF ADMISSION:  10/26/2011  ER REFERRING PHYSICIAN: Governor Rooksebecca Lord, MD  PRIMARY CARE PHYSICIAN: Vonita MossMark Crissman, MD   CHIEF COMPLAINT: Decreased responsiveness today.   HISTORY OF PRESENT ILLNESS: The patient is a 81 year old Caucasian female with a history of hypertension, transient ischemic attack, presented to the ED with decreased unresponsiveness today. The patient came from an assisted living home. She was fine, walking, talking independently today until this morning when she was found to have decreased responsiveness. She could not talk for a moment. Then she was sent the ED for further evaluation. The patient is alert, awake x3. She cannot provide any detailed information. She denies any symptoms. According to her Power of Attorney, the patient also had a fall three weeks ago, got bruises on the arms and legs, but no fracture. She said the patient looks a little bit dehydrated, but appetite is as usual. The patient has had similar symptoms when the patient was diagnosed as a TIA several years ago.   PAST MEDICAL HISTORY:  1. Transient ischemic attacks.  2. Hypertension.  3. Esophageal strictures, status post multiple dilatations. 4. Macular degeneration of bilateral eyes.  PAST SURGICAL HISTORY:  1. Cholecystectomy.  2. Ovary resection.  3. Hysterectomy.   SOCIAL HISTORY: No smoking, alcohol drinking, or illicit drugs.    ALLERGIES: Tramadol, Carafate.   MEDICATIONS:  1. Altace 5 mg p.o. daily.  2. Atenolol 25 mg p.o. daily, discontinued yesterday. 3. Omeprazole 20 mg p.o. daily, discontinued last year.  4. Aspirin 81 mg p.o. daily.  5. Mirtazapine 15 mg p.o. at bedtime.  6. Latanoprost 0.005%, one drop both eyes at bedtime.  7. MiraLax 17 grams, take with 8 ounces of water daily at bedtime.  8. Vitamin D 50,000 units once a month.  9. Ventolin HFA 2 puffs t.i.d. p.r.n. for  wheezing. 10. Artificial Tears 1 drop to eye for irritation p.r.n.   REVIEW OF SYSTEMS: The patient has decreased responsiveness. She denies any symptoms.   PHYSICAL EXAMINATION:  VITALS: Temperature 96.1, blood pressure 145/65, pulse 51, respirations 18, oxygen saturation 99%.   GENERAL: The patient is awake, alert and oriented x2. No acute distress.   HEENT: Pupils are round, equal, and reactive to light and accommodation. Dry oral mucosa. Clear oropharynx.   NECK: Supple. No JVD or carotid bruits. No lymphadenopathy. No thyromegaly.   CARDIOVASCULAR: S1, S2 regular rate and rhythm. No murmurs or gallops.   PULMONARY: Bilateral air entry. No wheezing or rales. No use of  muscles to breathe.   ABDOMEN: Soft. No distention or tenderness. No organomegaly. Bowel sounds are present.   EXTREMITIES: No edema, clubbing, or cyanosis. No calf tenderness. Strong pedal pulses.   SKIN: No rash or jaundice. No bruises. Turgor positive.   NEUROLOGICAL: Alert and oriented x2, cooperative. No focal deficit. Deep tendon reflexes 2+.   LABORATORY, DIAGNOSTIC AND RADIOLOGICAL DATA:  Urinalysis is negative.  CAT scan of head showed diffuse cerebellar atrophy as well as chronic white matter ischemia. Mild soft tissue swelling noted over the left frontal region.  Electrolytes are normal. BUN is 14, creatinine 0.88. Troponin less than 0.02.  WBC 5.7, hemoglobin 13.8, platelets 141.  Chest x-ray stable, cardiomegaly. Tiny calcified granulomas are seen in the lungs.  EKG: Sinus bradycardia at 57 beats per minute with incomplete right bundle branch block.   IMPRESSION:  1. Altered mental status, unknown etiology, possibly due to dehydration, transient  ischemic attack, or mirtazapine.  2. Dehydration.  3. Possible transient ischemic attack.  4. Hypertension.  5. Thrombocytopenia.  6. Malnutrition.  PLAN OF TREATMENT:  1. The patient will be placed for observation.  2. We will give her gentle  rehydration with IV fluids, encourage eating and drinking, and get a dietitian consult.  3. Continue aspirin.  4. Fall and aspiration precautions.  5. GI and deep vein thrombosis prophylaxis.   I discussed the patient's situation and plan of treatment with the patient's Power of Attorney.  She agrees to the current treatment plan.   CODE STATUS:  I also discussed the patient's CODE STATUS with the Power of Attorney. She said the patient preferred resuscitation, so the patient is a FULL CODE.    TIME SPENT: 60 minutes. ____________________________ Shaune Pollack, MD qc:cbb D: 10/26/2011 14:14:29 ET T: 10/26/2011 15:05:36 ET JOB#: [id number] cc: Steele Sizer, MD Shaune Pollack MD ELECTRONICALLY SIGNED 10/26/2011 19:01

## 2015-02-02 DIAGNOSIS — F0150 Vascular dementia without behavioral disturbance: Secondary | ICD-10-CM

## 2015-02-02 DIAGNOSIS — E43 Unspecified severe protein-calorie malnutrition: Secondary | ICD-10-CM | POA: Diagnosis not present

## 2015-02-02 DIAGNOSIS — K222 Esophageal obstruction: Secondary | ICD-10-CM | POA: Diagnosis not present

## 2015-02-02 DIAGNOSIS — I69398 Other sequelae of cerebral infarction: Secondary | ICD-10-CM | POA: Diagnosis not present

## 2015-02-02 DIAGNOSIS — I10 Essential (primary) hypertension: Secondary | ICD-10-CM | POA: Diagnosis not present

## 2015-04-05 DIAGNOSIS — N390 Urinary tract infection, site not specified: Secondary | ICD-10-CM | POA: Diagnosis not present

## 2015-04-10 ENCOUNTER — Encounter: Payer: Self-pay | Admitting: Emergency Medicine

## 2015-04-10 ENCOUNTER — Inpatient Hospital Stay
Admission: EM | Admit: 2015-04-10 | Discharge: 2015-05-02 | DRG: 177 | Disposition: E | Payer: Medicare Other | Attending: Internal Medicine | Admitting: Internal Medicine

## 2015-04-10 ENCOUNTER — Telehealth: Payer: Self-pay | Admitting: Family Medicine

## 2015-04-10 ENCOUNTER — Emergency Department: Payer: Medicare Other

## 2015-04-10 DIAGNOSIS — F329 Major depressive disorder, single episode, unspecified: Secondary | ICD-10-CM | POA: Diagnosis present

## 2015-04-10 DIAGNOSIS — J869 Pyothorax without fistula: Secondary | ICD-10-CM | POA: Diagnosis present

## 2015-04-10 DIAGNOSIS — Z888 Allergy status to other drugs, medicaments and biological substances status: Secondary | ICD-10-CM

## 2015-04-10 DIAGNOSIS — Z8701 Personal history of pneumonia (recurrent): Secondary | ICD-10-CM | POA: Diagnosis not present

## 2015-04-10 DIAGNOSIS — F0390 Unspecified dementia without behavioral disturbance: Secondary | ICD-10-CM | POA: Diagnosis present

## 2015-04-10 DIAGNOSIS — J9589 Other postprocedural complications and disorders of respiratory system, not elsewhere classified: Secondary | ICD-10-CM

## 2015-04-10 DIAGNOSIS — Z79899 Other long term (current) drug therapy: Secondary | ICD-10-CM | POA: Diagnosis not present

## 2015-04-10 DIAGNOSIS — R64 Cachexia: Secondary | ICD-10-CM | POA: Diagnosis present

## 2015-04-10 DIAGNOSIS — Z87440 Personal history of urinary (tract) infections: Secondary | ICD-10-CM

## 2015-04-10 DIAGNOSIS — J9601 Acute respiratory failure with hypoxia: Secondary | ICD-10-CM | POA: Diagnosis present

## 2015-04-10 DIAGNOSIS — J9602 Acute respiratory failure with hypercapnia: Secondary | ICD-10-CM | POA: Diagnosis present

## 2015-04-10 DIAGNOSIS — Z9889 Other specified postprocedural states: Secondary | ICD-10-CM

## 2015-04-10 DIAGNOSIS — Z66 Do not resuscitate: Secondary | ICD-10-CM | POA: Diagnosis not present

## 2015-04-10 DIAGNOSIS — J189 Pneumonia, unspecified organism: Secondary | ICD-10-CM

## 2015-04-10 DIAGNOSIS — I10 Essential (primary) hypertension: Secondary | ICD-10-CM | POA: Diagnosis present

## 2015-04-10 DIAGNOSIS — Z90710 Acquired absence of both cervix and uterus: Secondary | ICD-10-CM

## 2015-04-10 DIAGNOSIS — Z515 Encounter for palliative care: Secondary | ICD-10-CM | POA: Diagnosis not present

## 2015-04-10 DIAGNOSIS — J690 Pneumonitis due to inhalation of food and vomit: Secondary | ICD-10-CM | POA: Diagnosis present

## 2015-04-10 DIAGNOSIS — I509 Heart failure, unspecified: Secondary | ICD-10-CM | POA: Diagnosis present

## 2015-04-10 DIAGNOSIS — M810 Age-related osteoporosis without current pathological fracture: Secondary | ICD-10-CM | POA: Diagnosis present

## 2015-04-10 DIAGNOSIS — Z8249 Family history of ischemic heart disease and other diseases of the circulatory system: Secondary | ICD-10-CM | POA: Diagnosis not present

## 2015-04-10 DIAGNOSIS — Z808 Family history of malignant neoplasm of other organs or systems: Secondary | ICD-10-CM | POA: Diagnosis not present

## 2015-04-10 DIAGNOSIS — I272 Other secondary pulmonary hypertension: Secondary | ICD-10-CM | POA: Diagnosis present

## 2015-04-10 DIAGNOSIS — Z681 Body mass index (BMI) 19 or less, adult: Secondary | ICD-10-CM | POA: Diagnosis not present

## 2015-04-10 DIAGNOSIS — K219 Gastro-esophageal reflux disease without esophagitis: Secondary | ICD-10-CM | POA: Diagnosis present

## 2015-04-10 DIAGNOSIS — H409 Unspecified glaucoma: Secondary | ICD-10-CM | POA: Diagnosis present

## 2015-04-10 DIAGNOSIS — Z9049 Acquired absence of other specified parts of digestive tract: Secondary | ICD-10-CM | POA: Diagnosis present

## 2015-04-10 DIAGNOSIS — Z8673 Personal history of transient ischemic attack (TIA), and cerebral infarction without residual deficits: Secondary | ICD-10-CM

## 2015-04-10 DIAGNOSIS — J90 Pleural effusion, not elsewhere classified: Secondary | ICD-10-CM

## 2015-04-10 DIAGNOSIS — J9690 Respiratory failure, unspecified, unspecified whether with hypoxia or hypercapnia: Secondary | ICD-10-CM

## 2015-04-10 HISTORY — DX: Metabolic encephalopathy: G93.41

## 2015-04-10 HISTORY — DX: Unspecified dementia, unspecified severity, without behavioral disturbance, psychotic disturbance, mood disturbance, and anxiety: F03.90

## 2015-04-10 LAB — CBC
HCT: 39.8 % (ref 35.0–47.0)
HEMOGLOBIN: 12.8 g/dL (ref 12.0–16.0)
MCH: 30 pg (ref 26.0–34.0)
MCHC: 32.2 g/dL (ref 32.0–36.0)
MCV: 93.3 fL (ref 80.0–100.0)
PLATELETS: 202 10*3/uL (ref 150–440)
RBC: 4.26 MIL/uL (ref 3.80–5.20)
RDW: 14.2 % (ref 11.5–14.5)
WBC: 8.3 10*3/uL (ref 3.6–11.0)

## 2015-04-10 LAB — COMPREHENSIVE METABOLIC PANEL
ALT: 10 U/L — ABNORMAL LOW (ref 14–54)
ANION GAP: 7 (ref 5–15)
AST: 18 U/L (ref 15–41)
Albumin: 3.3 g/dL — ABNORMAL LOW (ref 3.5–5.0)
Alkaline Phosphatase: 97 U/L (ref 38–126)
BUN: 19 mg/dL (ref 6–20)
CHLORIDE: 100 mmol/L — AB (ref 101–111)
CO2: 29 mmol/L (ref 22–32)
CREATININE: 0.79 mg/dL (ref 0.44–1.00)
Calcium: 8.3 mg/dL — ABNORMAL LOW (ref 8.9–10.3)
GFR calc Af Amer: >60 mL/min (ref >60)
Glucose, Bld: 111 mg/dL — ABNORMAL HIGH (ref 65–99)
Potassium: 3.9 mmol/L (ref 3.5–5.1)
Sodium: 136 mmol/L (ref 135–145)
Total Bilirubin: 0.4 mg/dL (ref 0.3–1.2)
Total Protein: 6 g/dL — ABNORMAL LOW (ref 6.5–8.1)

## 2015-04-10 LAB — BRAIN NATRIURETIC PEPTIDE: B Natriuretic Peptide: 250 pg/mL — ABNORMAL HIGH (ref 0.0–100.0)

## 2015-04-10 LAB — BLOOD GAS, ARTERIAL
ALLENS TEST (PASS/FAIL): POSITIVE — AB
Acid-Base Excess: 6 mmol/L — ABNORMAL HIGH (ref 0.0–3.0)
Bicarbonate: 33 mEq/L — ABNORMAL HIGH (ref 21.0–28.0)
FIO2: 0.36 %
O2 Saturation: 86.6 %
PCO2 ART: 57 mmHg — AB (ref 32.0–48.0)
PO2 ART: 54 mmHg — AB (ref 83.0–108.0)
Patient temperature: 37
pH, Arterial: 7.37 (ref 7.350–7.450)

## 2015-04-10 LAB — TROPONIN I

## 2015-04-10 LAB — MAGNESIUM: MAGNESIUM: 1.7 mg/dL (ref 1.7–2.4)

## 2015-04-10 LAB — LACTIC ACID, PLASMA
LACTIC ACID, VENOUS: 1.6 mmol/L (ref 0.5–2.0)
Lactic Acid, Venous: 2.2 mmol/L (ref 0.5–2.0)

## 2015-04-10 MED ORDER — SENNOSIDES-DOCUSATE SODIUM 8.6-50 MG PO TABS
1.0000 | ORAL_TABLET | Freq: Every evening | ORAL | Status: DC | PRN
  Administered 2015-04-16: 1 via ORAL
  Filled 2015-04-10: qty 1

## 2015-04-10 MED ORDER — PANTOPRAZOLE SODIUM 40 MG PO TBEC
40.0000 mg | DELAYED_RELEASE_TABLET | Freq: Every day | ORAL | Status: DC
  Administered 2015-04-11 – 2015-04-16 (×6): 40 mg via ORAL
  Filled 2015-04-10 (×7): qty 1

## 2015-04-10 MED ORDER — PIPERACILLIN-TAZOBACTAM 3.375 G IVPB
3.3750 g | Freq: Three times a day (TID) | INTRAVENOUS | Status: DC
  Administered 2015-04-10 – 2015-04-14 (×11): 3.375 g via INTRAVENOUS
  Filled 2015-04-10 (×16): qty 50

## 2015-04-10 MED ORDER — ALBUTEROL SULFATE (2.5 MG/3ML) 0.083% IN NEBU
2.5000 mg | INHALATION_SOLUTION | Freq: Four times a day (QID) | RESPIRATORY_TRACT | Status: DC
  Administered 2015-04-11 – 2015-04-13 (×11): 2.5 mg via RESPIRATORY_TRACT
  Filled 2015-04-10 (×11): qty 3

## 2015-04-10 MED ORDER — SODIUM CHLORIDE 0.9 % IV BOLUS (SEPSIS)
500.0000 mL | INTRAVENOUS | Status: AC
  Administered 2015-04-10: 500 mL via INTRAVENOUS

## 2015-04-10 MED ORDER — LATANOPROST 0.005 % OP SOLN
1.0000 [drp] | Freq: Every day | OPHTHALMIC | Status: DC
  Administered 2015-04-10 – 2015-04-18 (×9): 1 [drp] via OPHTHALMIC
  Filled 2015-04-10: qty 2.5

## 2015-04-10 MED ORDER — VANCOMYCIN HCL IN DEXTROSE 1-5 GM/200ML-% IV SOLN: 1000.0000 mg | INTRAVENOUS | Status: DC

## 2015-04-10 MED ORDER — VANCOMYCIN HCL IN DEXTROSE 1-5 GM/200ML-% IV SOLN
1000.0000 mg | Freq: Once | INTRAVENOUS | Status: AC
Start: 2015-04-10 — End: 2015-04-10
  Administered 2015-04-10: 1000 mg via INTRAVENOUS

## 2015-04-10 MED ORDER — ASPIRIN EC 81 MG PO TBEC
81.0000 mg | DELAYED_RELEASE_TABLET | Freq: Every day | ORAL | Status: DC
  Administered 2015-04-11 – 2015-04-16 (×6): 81 mg via ORAL
  Filled 2015-04-10 (×7): qty 1

## 2015-04-10 MED ORDER — PIPERACILLIN-TAZOBACTAM 3.375 G IVPB 30 MIN
3.3750 g | Freq: Four times a day (QID) | INTRAVENOUS | Status: DC
  Filled 2015-04-10 (×3): qty 50

## 2015-04-10 MED ORDER — MIRTAZAPINE 15 MG PO TABS
15.0000 mg | ORAL_TABLET | Freq: Every day | ORAL | Status: DC
  Administered 2015-04-10 – 2015-04-18 (×9): 15 mg via ORAL
  Filled 2015-04-10 (×9): qty 1

## 2015-04-10 MED ORDER — PIPERACILLIN-TAZOBACTAM 3.375 G IVPB: 3.3750 g | Freq: Once | INTRAVENOUS | Status: DC

## 2015-04-10 MED ORDER — VANCOMYCIN HCL 500 MG IV SOLR
500.0000 mg | INTRAVENOUS | Status: DC
  Administered 2015-04-11 – 2015-04-13 (×3): 500 mg via INTRAVENOUS
  Filled 2015-04-10 (×6): qty 500

## 2015-04-10 MED ORDER — ACETAMINOPHEN 650 MG RE SUPP
650.0000 mg | Freq: Four times a day (QID) | RECTAL | Status: DC | PRN
  Administered 2015-04-17: 650 mg via RECTAL
  Filled 2015-04-10: qty 1

## 2015-04-10 MED ORDER — ENOXAPARIN SODIUM 40 MG/0.4ML ~~LOC~~ SOLN
40.0000 mg | SUBCUTANEOUS | Status: DC
  Administered 2015-04-10: 40 mg via SUBCUTANEOUS
  Filled 2015-04-10: qty 0.4

## 2015-04-10 MED ORDER — ACETAMINOPHEN 325 MG PO TABS: 650.0000 mg | ORAL_TABLET | Freq: Four times a day (QID) | ORAL | Status: DC | PRN

## 2015-04-10 MED ORDER — VANCOMYCIN HCL IN DEXTROSE 1-5 GM/200ML-% IV SOLN
INTRAVENOUS | Status: AC
  Administered 2015-04-10: 1000 mg via INTRAVENOUS
  Filled 2015-04-10: qty 200

## 2015-04-10 MED ORDER — SODIUM CHLORIDE 0.9 % IV SOLN
INTRAVENOUS | Status: DC
  Administered 2015-04-11 – 2015-04-14 (×5): via INTRAVENOUS

## 2015-04-10 NOTE — ED Provider Notes (Signed)
[facility] Emergency Department Provider Note  ____________________________________________  Time seen: Approximately 4:32 PM  I have reviewed the triage vital signs and the nursing notes.   HISTORY  Chief Complaint Shortness of Breath  History is limited by dementia and acute shortness of breath  HPI Lindsay Branch is a 81 y.o. female with a history of dementia, pulmonary hypertension, and possible CHF according to [facility] facility although it does not appear on her history.  She presents with dyspnea and hypoxemia observe first at the nursing home.  When the facility checked on her today she was found to be in respiratory distress with retractions and tachypnea.  They checked her SPO2 which was in the low 70s.  She was placed on 4 L nasal cannula at the facility and had an SPO2 in the low 80s.  EMS arrived and found her tachypnea can the 40s and hypoxemic.  On a nonrebreather her oxygen saturation came up to 100%.  Upon arrival in the emergency department the patient remains tachypnea And slightly retracting although her SPO2 is in the upper 90s, now on nasal cannula 4 L.  She is unable to provide a history but also responds to questions appropriately and has no complaints at this time in spite of her obvious respiratory distress.   Past Medical History  Diagnosis Date  . GERD (gastroesophageal reflux disease)     with past esophageal dilatation  . Hypertension   . Osteoporosis   . Anemia   . Depression   . TIA (transient ischemic attack)   . Urinary incontinence   . Pulmonary hypertension   . Metabolic encephalopathy   . Dementia     Patient Active Problem List   Diagnosis Date Noted  . Weakness 10/25/2011  . Loss of weight 04/05/2011  . Other constipation 01/04/2011  . DEPRESSION 06/22/2010  . TRICUSPID REGURGITATION 01/19/2010  . TIA 06/09/2009  . URINARY INCONTINENCE 06/24/2008  . ANEMIA-NOS 05/26/2008  . HYPERTENSION  05/26/2008  . GERD 05/26/2008  . OSTEOPOROSIS 05/26/2008    Past Surgical History  Procedure Laterality Date  . Abdominal hysterectomy      1 ovary left  . Perforated esophagus during dilation  1995  . Fracture surgery  1999    Hip  . Cholecystectomy  06/2008    Current Outpatient Rx  Name  Route  Sig  Dispense  Refill  . aspirin 81 MG EC tablet   Oral   Take 81 mg by mouth daily.           . ergocalciferol (VITAMIN D2) 50000 UNITS capsule   Oral   Take 1 capsule (50,000 Units total) by mouth every 30 (thirty) days.   1 capsule   11   . latanoprost (XALATAN) 0.005 % ophthalmic solution   Both Eyes   Place 1 drop into both eyes at bedtime.           . mirtazapine (REMERON) 15 MG tablet   Oral   Take 15 mg by mouth at bedtime.           Marland Kitchen. omeprazole (PRILOSEC) 20 MG capsule   Oral   Take 1 capsule (20 mg total) by mouth daily.   30 capsule   3     Allergies Levaquin; Sucralfate; and Tramadol  Family History  Problem Relation Age of Onset  . Cancer Mother     brain cancer  . Heart disease Father     Social History History  Substance Use Topics  . Smoking status: Never Smoker   . Smokeless tobacco: Never Used  . Alcohol Use: No    Review of Systems Unable to obtain secondary to dementia  ____________________________________________   PHYSICAL EXAM:  ED Triage Vitals  Enc Vitals Group     BP 04/22/2015 1631 154/84 mmHg     Pulse Rate 04/25/2015 1631 86     Resp 04/09/2015 1631 33     Temp 05/01/2015 1631 97.7 F (36.5 C)     Temp Source 04/30/2015 1631 Oral     SpO2 04/08/2015 1631 95 %     Weight 04/07/2015 1631 106 lb 2 oz (48.138 kg)     Height 04/23/2015 1631  (1.6 m)     Head Cir --      Peak Flow --      Pain Score --      Pain Loc --      Pain Edu? --      Excl. in GC? --      Constitutional: Alert, response to voice, but appears tired and in moderate respiratory distress Eyes: Conjunctivae are normal. PERRL. EOMI. Head:  Atraumatic. Nose: No congestion/rhinnorhea. Mouth/Throat: Mucous membranes are moist.  Oropharynx non-erythematous. Neck: No stridor.   Cardiovascular: Normal rate, regular rhythm. Grossly normal heart sounds.  Good peripheral circulation. Respiratory: Increased respiratory effort with mild retractions and tachypnea in the 30s to 40s.  Bibasilar rales appreciated on auscultation.  No wheezing. Gastrointestinal: Soft and nontender. No distention. No abdominal bruits. No CVA tenderness. Musculoskeletal: Trace lower extremity pitting edema.  No joint effusions. Neurologic:  Normal speech and language. No gross focal neurologic deficits are appreciated. Speech is normal. Skin:  Skin is warm, dry and intact. No rash noted.   ____________________________________________   LABS (all labs ordered are listed, but only abnormal results are displayed)  Labs Reviewed  BLOOD GAS, ARTERIAL - Abnormal; Notable for the following:    pCO2 arterial 57 (*)    pO2, Arterial 54 (*)    Bicarbonate 33.0 (*)    Acid-Base Excess 6.0 (*)    Allens test (pass/fail) POSITIVE (*)    All other components within normal limits  COMPREHENSIVE METABOLIC PANEL - Abnormal; Notable for the following:    Chloride 100 (*)    Glucose, Bld 111 (*)    Calcium 8.3 (*)    Total Protein 6.0 (*)    Albumin 3.3 (*)    ALT 10 (*)    All other components within normal limits  CULTURE, BLOOD (ROUTINE X 2)  CULTURE, BLOOD (ROUTINE X 2)  CBC  TROPONIN I  MAGNESIUM  LACTIC ACID, PLASMA  BRAIN NATRIURETIC PEPTIDE  URINALYSIS COMPLETEWITH MICROSCOPIC ([facility] ONLY)  LACTIC ACID, PLASMA   ____________________________________________  EKG  ED ECG REPORT I, Viraj Liby, the attending physician, personally viewed and interpreted this ECG.   Date: 04/30/2015  EKG Time: 16:33  Rate: 87  Rhythm: normal sinus rhythm  Axis: Normal  Intervals:Incomplete right bundle branch block  ST&T Change: Non-specific ST segment / T-wave  changes, but no evidence of acute ischemia.   ____________________________________________  RADIOLOGY  I, Evania Lyne, personally viewed and evaluated these images as part of my medical decision making.   Dg Chest Port 1 View  04/25/2015   CLINICAL DATA:  81 year old female with shortness of breath.  EXAM: PORTABLE CHEST - 1 VIEW  COMPARISON:  09/13/2013 prior radiographs  FINDINGS: Cardiomegaly noted in this low volume film.  Bibasilar opacities are  again identified likely representing atelectasis/ scarring.  There is no evidence of pulmonary edema, suspicious pulmonary nodule/mass, pleural effusion, or pneumothorax. No acute bony abnormalities are identified.  IMPRESSION: Cardiomegaly with bibasilar opacities again noted -likely atelectasis/ scarring.  .   Electronically Signed   By: Harmon Pier M.D.   On: 05/01/2015 17:40    ____________________________________________   PROCEDURES  Procedure(s) performed: None  Critical Care performed: Yes, see critical care note(s)  CRITICAL CARE Performed by: Loleta Rose   Total critical care time: 30 minutes  Critical care time was exclusive of separately billable procedures and treating other patients.  Critical care was necessary to treat or prevent imminent or life-threatening deterioration.  Critical care was time spent personally by me on the following activities: development of treatment plan with patient and/or surrogate as well as nursing, discussions with consultants, evaluation of patient's response to treatment, examination of patient, obtaining history from patient or surrogate, ordering and performing treatments and interventions, ordering and review of laboratory studies, ordering and review of radiographic studies, pulse oximetry and re-evaluation of patient's condition.  ____________________________________________   INITIAL IMPRESSION / ASSESSMENT AND PLAN / ED COURSE  Pertinent labs & imaging results that were  available during my care of the patient were reviewed by me and considered in my medical decision making (see chart for details).  The patient arrives in respiratory distress, no longer hypoxemic but was significantly hypoxemic prior to arrival.  We will obtain labs, EKG, portable chest x-ray, and ABG.  ----------------------------------------- 6:18 PM on 04/07/2015 -----------------------------------------  The patient feels better and is retracting less than before.  Her ABG is notable for both hypoxemia and hypercarbia.  Her lactate is 1.6.  I am giving a small fluid bolus and Zosyn and vancomycin for healthcare associated pneumonia; though the radiologist comments on chronic bibasilar atelectasis, I am concerned that she has pneumonia that is being masked by some chronic lung changes.  I have discussed the findings with the family and with Dr. Luberta Mutter, hospitalist, who will admit.  ____________________________________________  FINAL CLINICAL IMPRESSION(S) / ED DIAGNOSES  Final diagnoses:  Acute respiratory failure with hypoxia and hypercarbia      NEW MEDICATIONS STARTED DURING THIS VISIT:  New Prescriptions   No medications on file     Loleta Rose, MD 04/25/2015 1819

## 2015-04-10 NOTE — Telephone Encounter (Signed)
[facility] called, Clementeen HoofSusie Nelson, LPN to report patient sent out to ED for Hypoxia and respiratory distress tachypnic at 30-40 and 77 % on RA. Once rebreather applied by EMS O2 recovered to 90s. Patient's POA was visiting and was concerned about patient status. She was transported to hospital and then they called

## 2015-04-10 NOTE — H&P (Signed)
[facility] [place] [facility]   PATIENT NAME: Lindsay SheldonCarolyn Pals    MR#:  [id number]  DATE OF BIRTH:  01-20-1913  DATE OF ADMISSION:  04/12/2015  PRIMARY CARE PHYSICIAN: Tillman Abideichard Letvak, MD   REQUESTING/REFERRING PHYSICIAN:Dr.Forbach CHIEF COMPLAINT:   Chief Complaint  Patient presents with  . Shortness of Breath    HISTORY OF PRESENT ILLNESS:  Lindsay Branch  is a 81 y.o. female with a known history of CHF, pulmonary hypertension, dementia brought in from [facility] secondary to respiratory distressed and tachypnea. Patient to O2 saturations were 70% at [facility]. Patient placed on 4 L of nasal cannula and the EMS was called and when EMS arrived patient has tachypnea with respiratory rate of 40 and hypoxemia. Patient placed  on nonrebreather and saturations went up to 100. When she came to emergency room patient has significant tachypnea and the muscles retraction. Patient to O2 saturations improved to 90% on 4 L. Patient chest x-ray was concerning for pneumonia. According to the family patient started on  oxygen for the past one and half week because of the same problem. Patient has no fever has some cough. No chest pain. Patient right now he is breathing comfortably and saturations are 99% on 3 L of nasal cannula. Received 1 dose of vancomycin, Zosyn. He is slightly demented unable to give me any complaints. But not appearing to be in distress. Noted to have more confusion and the family thought that secondary to hypoxia.  PAST MEDICAL HISTORY:   Past Medical History  Diagnosis Date  . GERD (gastroesophageal reflux disease)     with past esophageal dilatation  . Hypertension   . Osteoporosis   . Anemia   . Depression   . TIA (transient ischemic attack)   . Urinary incontinence   . Pulmonary hypertension   . Metabolic encephalopathy   . Dementia     PAST SURGICAL HISTOIRY:   Past Surgical History  Procedure Laterality Date  . Abdominal  hysterectomy      1 ovary left  . Perforated esophagus during dilation  1995  . Fracture surgery  1999    Hip  . Cholecystectomy  06/2008    SOCIAL HISTORY:   History  Substance Use Topics  . Smoking status: Never Smoker   . Smokeless tobacco: Never Used  . Alcohol Use: No    FAMILY HISTORY:   Family History  Problem Relation Age of Onset  . Cancer Mother     brain cancer  . Heart disease Father     DRUG ALLERGIES:   Allergies  Allergen Reactions  . Sucralfate Rash  . Tramadol     REACTION: hallucinations, paranoia  . Levofloxacin Rash    REVIEW OF SYSTEMS:  CONSTITUTIONAL: No fever, fatigue or weakness.  EYES: No blurred or double vision.  EARS, NOSE, AND THROAT: No tinnitus or ear pain.  RESPIRATORY: No cough, shortness of breath, wheezing or hemoptysis.  CARDIOVASCULAR: No chest pain, orthopnea, edema.  GASTROINTESTINAL: No nausea, vomiting, diarrhea or abdominal pain.  GENITOURINARY: No dysuria, hematuria.  ENDOCRINE: No polyuria, nocturia,  HEMATOLOGY: No anemia, easy bruising or bleeding SKIN: No rash or lesion. MUSCULOSKELETAL: No joint pain or arthritis.   NEUROLOGIC: No tingling, numbness, weakness.  PSYCHIATRY: No anxiety or depression.   MEDICATIONS AT HOME:   Prior to Admission medications   Medication Sig Start Date End Date Taking? Authorizing Provider  benzonatate (TESSALON) 100 MG capsule Take 100 mg by mouth 3 (three)  times daily as needed. For cough 01/17/15  Yes Historical Provider, MD  ergocalciferol (VITAMIN D2) 50000 UNITS capsule Take 1 capsule (50,000 Units total) by mouth every 30 (thirty) days. 08/02/11   Karie Schwalbe, MD  latanoprost (XALATAN) 0.005 % ophthalmic solution Place 1 drop into both eyes at bedtime.      Historical Provider, MD  mirtazapine (REMERON) 15 MG tablet Take 15 mg by mouth at bedtime.      Historical Provider, MD  omeprazole (PRILOSEC) 20 MG capsule Take 1 capsule (20 mg total) by mouth daily. 12/21/11    Karie Schwalbe, MD      VITAL SIGNS:  Blood pressure 138/75, pulse 77, temperature 97.7 F (36.5 C), temperature source Oral, resp. rate 31, height 5\' 3"  (1.6 m), weight 48.138 kg (106 lb 2 oz), SpO2 99 %.  PHYSICAL EXAMINATION:  GENERAL:  81 y.o.-year-old patient lying in the bed with no acute distress.  EYES: Pupils equal, round, reactive to light and accommodation. No scleral icterus. Extraocular muscles intact.  HEENT: Head atraumatic, normocephalic. Oropharynx and nasopharynx clear.  NECK:  Supple, no jugular venous distention. No thyroid enlargement, no tenderness.  LUNGS:Is breath sounds at bases. No rales, no wheezing. Cardiac:: S1, S2 normal. No murmurs, rubs, or gallops.  ABDOMEN: Soft, nontender, nondistended. Bowel sounds present. No organomegaly or mass.  EXTREMITIES: No pedal edema, cyanosis, or clubbing.  NEUROLOGIC: Cranial nerves II through XII are intact. Muscle strength 5/5 in all extremities. Sensation intact. Gait not checked.  PSYCHIATRIC: The patient is alert and oriented x 3.  SKIN: No obvious rash, lesion, or ulcer.   LABORATORY PANEL:   CBC  Recent Labs Lab 04/13/2015 1637  WBC 8.3  HGB 12.8  HCT 39.8  PLT 202   ------------------------------------------------------------------------------------------------------------------  Chemistries   Recent Labs Lab 04/25/2015 1637  NA 136  K 3.9  CL 100*  CO2 29  GLUCOSE 111*  BUN 19  CREATININE 0.79  CALCIUM 8.3*  MG 1.7  AST 18  ALT 10*  ALKPHOS 97  BILITOT 0.4   ------------------------------------------------------------------------------------------------------------------  Cardiac Enzymes  Recent Labs Lab 04/24/2015 1637  TROPONINI <0.03   ------------------------------------------------------------------------------------------------------------------  RADIOLOGY:  Dg Chest Port 1 View  04/29/2015   CLINICAL DATA:  81 year old female with shortness of breath.  EXAM: PORTABLE CHEST -  1 VIEW  COMPARISON:  09/13/2013 prior radiographs  FINDINGS: Cardiomegaly noted in this low volume film.  Bibasilar opacities are again identified likely representing atelectasis/ scarring.  There is no evidence of pulmonary edema, suspicious pulmonary nodule/mass, pleural effusion, or pneumothorax. No acute bony abnormalities are identified.  IMPRESSION: Cardiomegaly with bibasilar opacities again noted -likely atelectasis/ scarring.  .   Electronically Signed   By: Harmon Pier M.D.   On: 04/02/2015 17:40    EKG:   Orders placed or performed during the hospital encounter of 04/02/2015  . EKG test  . EKG test  Normal sinus rhythm with ST 87 bpm no ST-T changes  IMPRESSION AND PLAN:   1' acute hypoxic respiratory failure secondary to bilateral pneumonia. Patient of likely has a healthy Associated pneumonia with elevated lactic acid. Continue vancomycin and Zosyn, although Dr. nebulizers. Follow blood cultures, continue oxygen.admit her to telemetry. #2: Possible speech therapy evaluation tomorrow morning due to her dementia and pneumonia.  3: GERD continue PPIs #4: History of depression continue mirtazapine. 5: History of glaucoma continue eyedrops. Palliative care tomorrow morning to discuss the goals of care. For now she is full code  All the  records are reviewed and case discussed with ED provider. Management plans discussed with the patient, family and they are in agreement.  CODE STATUS: Full  TOTAL TIME TAKING CARE OF THIS PATIENT:55 minutes    Kingslee Mairena M.D on 04/17/2015 at 6:32 PM  Between 7am to 6pm - Pager - [phone]  After 6pm go to [email or web address] - password [email or web address] [facility]  [facility]  Office  [phone]  CC: Primary care physician; Tillman Abide, MD

## 2015-04-10 NOTE — Progress Notes (Signed)
ANTIBIOTIC CONSULT NOTE - INITIAL  Pharmacy Consult for Vancomycin and Zosyn Indication: HCAP  Allergies  Allergen Reactions  . Sucralfate Rash  . Tramadol     REACTION: hallucinations, paranoia  . Levofloxacin Rash    Patient Measurements: Height: 5\' 3"  (160 cm) Weight: 104 lb 8 oz (47.401 kg) IBW/kg (Calculated) : 52.4 Adjusted Body Weight: n/a  Vital Signs: Temp: 97.7 F (36.5 C) (07/10 2028) Temp Source: Oral (07/10 1631) BP: 149/73 mmHg (07/10 2028) Pulse Rate: 72 (07/10 2028) Intake/Output from previous day:   Intake/Output from this shift:    Labs:  Recent Labs  06/23/2015 1637  WBC 8.3  HGB 12.8  PLT 202  CREATININE 0.79   Estimated Creatinine Clearance: 28.7 mL/min (by C-G formula based on Cr of 0.79). No results for input(s): VANCOTROUGH, VANCOPEAK, VANCORANDOM, GENTTROUGH, GENTPEAK, GENTRANDOM, TOBRATROUGH, TOBRAPEAK, TOBRARND, AMIKACINPEAK, AMIKACINTROU, AMIKACIN in the last 72 hours.   Microbiology: No results found for this or any previous visit (from the past 720 hour(s)).  Medical History: Past Medical History  Diagnosis Date  . GERD (gastroesophageal reflux disease)     with past esophageal dilatation  . Hypertension   . Osteoporosis   . Anemia   . Depression   . TIA (transient ischemic attack)   . Urinary incontinence   . Pulmonary hypertension   . Metabolic encephalopathy   . Dementia     Medications:  Anti-infectives    Start     Dose/Rate Route Frequency Ordered Stop   04/11/15 1800  vancomycin (VANCOCIN) 500 mg in sodium chloride 0.9 % 100 mL IVPB     500 mg 100 mL/hr over 60 Minutes Intravenous Every 24 hours 06/23/2015 2057     06/23/2015 2200  piperacillin-tazobactam (ZOSYN) IVPB 3.375 g     3.375 g 12.5 mL/hr over 240 Minutes Intravenous 3 times per day 06/23/2015 2048     06/23/2015 1830  vancomycin (VANCOCIN) IVPB 1000 mg/200 mL premix  Status:  Discontinued     1,000 mg 200 mL/hr over 60 Minutes Intravenous Every 24 hours  06/23/2015 1829 06/23/2015 2057   06/23/2015 1830  piperacillin-tazobactam (ZOSYN) IVPB 3.375 g  Status:  Discontinued     3.375 g 100 mL/hr over 30 Minutes Intravenous 4 times per day 06/23/2015 1829 06/23/2015 2048   06/23/2015 1815  piperacillin-tazobactam (ZOSYN) IVPB 3.375 g  Status:  Discontinued     3.375 g 12.5 mL/hr over 240 Minutes Intravenous  Once 06/23/2015 1804 06/23/2015 2048   06/23/2015 1815  vancomycin (VANCOCIN) IVPB 1000 mg/200 mL premix    Comments:  HCAP   1,000 mg 200 mL/hr over 60 Minutes Intravenous  Once 06/23/2015 1804 06/23/2015 1921     Assessment: Patient admitted for possible HCAP. Empirically started on Vancomycin 1g IV q24h and Zosyn 3.375g IV q6h.  Goal of Therapy:  Vancomycin trough level 15-20 mcg/ml  Plan:  Measure antibiotic drug levels at steady state Follow up culture results Will renally adjust doses per protocol. Will order Vancomycin 500mg  IV q24h and Zosyn 3.375g IV q8h EI.  Clovia CuffLisa Cobin Cadavid, PharmD, BCPS 16-May-2015 9:00 PM

## 2015-04-10 NOTE — ED Notes (Signed)
[facility]At[facility] with c.o respiratory distress. Per EMS patient was on 4L [place] with room saturations of 80's. Per EMS, patient was placed on a nonrebreather and oxygen saturations elevated to 100%. Patient is alert and able to follow instructions and answer questions. Per EMS patient does not wear any home oxygen.

## 2015-04-11 ENCOUNTER — Telehealth: Payer: Self-pay

## 2015-04-11 LAB — BASIC METABOLIC PANEL
Anion gap: 3 — ABNORMAL LOW (ref 5–15)
BUN: 15 mg/dL (ref 6–20)
CALCIUM: 8.1 mg/dL — AB (ref 8.9–10.3)
CHLORIDE: 107 mmol/L (ref 101–111)
CO2: 31 mmol/L (ref 22–32)
Creatinine, Ser: 0.68 mg/dL (ref 0.44–1.00)
GFR calc Af Amer: >60 mL/min (ref >60)
GFR calc non Af Amer: >60 mL/min (ref >60)
GLUCOSE: 104 mg/dL — AB (ref 65–99)
Potassium: 3.9 mmol/L (ref 3.5–5.1)
SODIUM: 141 mmol/L (ref 135–145)

## 2015-04-11 LAB — CBC
HCT: 40.7 % (ref 35.0–47.0)
HEMOGLOBIN: 12.9 g/dL (ref 12.0–16.0)
MCH: 30 pg (ref 26.0–34.0)
MCHC: 31.8 g/dL — AB (ref 32.0–36.0)
MCV: 94.5 fL (ref 80.0–100.0)
Platelets: 178 10*3/uL (ref 150–440)
RBC: 4.31 MIL/uL (ref 3.80–5.20)
RDW: 14.4 % (ref 11.5–14.5)
WBC: 10.5 10*3/uL (ref 3.6–11.0)

## 2015-04-11 MED ORDER — ENOXAPARIN SODIUM 30 MG/0.3ML ~~LOC~~ SOLN
30.0000 mg | SUBCUTANEOUS | Status: DC
  Administered 2015-04-11 – 2015-04-18 (×8): 30 mg via SUBCUTANEOUS
  Filled 2015-04-11 (×8): qty 0.3

## 2015-04-11 NOTE — Progress Notes (Signed)
Rechecked pt's oxygen saturation and resp.  No distress noted on 3L O2 per [place]

## 2015-04-11 NOTE — Plan of Care (Signed)
Problem: SLP Dysphagia Goals Goal: Misc Dysphagia Goal Pt will safely tolerate po diet of least restrictive consistency w/ no overt s/s of aspiration noted by Staff/pt/family x3 sessions.    

## 2015-04-11 NOTE — Evaluation (Signed)
Clinical/Bedside Swallow Evaluation Patient Details  Name: Lindsay Branch MRN: [id number] Date of Birth: 1913-08-29  Today's Date: 04/11/2015 Time: SLP Start Time (ACUTE ONLY): 1140 SLP Stop Time (ACUTE ONLY): 1230 SLP Time Calculation (min) (ACUTE ONLY): 50 min  Past Medical History:  Past Medical History  Diagnosis Date  . GERD (gastroesophageal reflux disease)     with past esophageal dilatation  . Hypertension   . Osteoporosis   . Anemia   . Depression   . TIA (transient ischemic attack)   . Urinary incontinence   . Pulmonary hypertension   . Metabolic encephalopathy   . Dementia    Past Surgical History:  Past Surgical History  Procedure Laterality Date  . Abdominal hysterectomy      1 ovary left  . Perforated esophagus during dilation  1995  . Fracture surgery  1999    Hip  . Cholecystectomy  06/2008   HPI:  Pt is a 81 y.o. female with a known history of CHF, pulmonary hypertension, dementia brought in from [facility] secondary to respiratory distressed and tachypnea. Patient to O2 saturations were 70% at [facility]. Patient placed on 4 L of nasal cannula and the EMS was called and when EMS arrived patient has tachypnea with respiratory rate of 40 and hypoxemia. Patient placed on nonrebreather and saturations went up to 100. When she came to emergency room patient has significant tachypnea and the muscles retraction. Patient to O2 saturations improved to 90% on 4 L. Patient chest x-ray was concerning for pneumonia. According to the family patient started on oxygen for the past one and half week because of the same problem. Patient has no fever has some cough. NSG reported pt had no difficulty swallowing pills w/ water and eating her breakfast this AM; only ate a few bites/sips overall.    Assessment / Plan / Recommendation Clinical Impression  Pt appeared to demo. no overt s/s of aspiration w/ trials of liquids and purees/soft solids; she likes to wear her upper  dentures when eating. Pt exhibited no significant oral phase deficits w/ the trials; min. increased mastication time/effort w/ the increased texture trials. She was able to clear appropriately b/t trials - utilized lingual sweeping when needed. Pt demo. no decline in respiratory status; vocal quality clear b/t trials. Pt preferred using a straw; she has some shakiness in her UEs. Pt appears at reduced risk for aspiration following general aspiration precautions. Education on aspiration precautions given to pt and family friend who arrived re: allowing pt to help hold the cup during any drinking of liquids and not to use large straws; rec. small, single bites and sips and clearing mouth fully b/t bites. Pt and family friend agreed Pt would benefit from further f/u for education and diet toleration monitoring.     Aspiration Risk   (reduced)    Diet Recommendation Dysphagia 3 (Mech soft);Thin   Medication Administration: Whole meds with puree (if nec. ) Compensations: Slow rate;Small sips/bites (allow pt to hold cup for drinking - assist to support)    Other  Recommendations Recommended Consults:  (dietician) Oral Care Recommendations: Oral care BID;Oral care before and after PO;Staff/trained caregiver to provide oral care   Follow Up Recommendations       Frequency and Duration min 2x/week  1 week   Pertinent Vitals/Pain denied    SLP Swallow Goals     Swallow Study Prior Functional Status   resides [facility]; needs assistance    General Date of  Onset: 04/26/2015 Other Pertinent Information: Pt is a 81 y.o. female with a known history of CHF, pulmonary hypertension, dementia brought in from [facility] [place] secondary to respiratory distressed and tachypnea. Patient to O2 saturations were 70% at [facility]. Patient placed on 4 L of nasal cannula and the EMS was called and when EMS arrived patient has tachypnea with respiratory rate of 40 and hypoxemia. Patient placed on nonrebreather and  saturations went up to 100. When she came to emergency room patient has significant tachypnea and the muscles retraction. Patient to O2 saturations improved to 90% on 4 L. Patient chest x-ray was concerning for pneumonia. According to the family patient started on oxygen for the past one and half week because of the same problem. Patient has no fever has some cough. NSG reported pt had no difficulty swallowing pills w/ water and eating her breakfast this AM; only ate a few bites/sips overall.  Type of Study: Bedside swallow evaluation Previous Swallow Assessment: none Diet Prior to this Study: Regular;Thin liquids Temperature Spikes Noted: No Respiratory Status: Supplemental O2 delivered via (comment) (4 liters;  WBC 10.5) History of Recent Intubation: No Behavior/Cognition: Alert;Cooperative;Pleasant mood;Distractible;Requires cueing Oral Cavity - Dentition:  (lower dentition; upper dentures not in place) Self-Feeding Abilities: Needs assist;Needs set up Patient Positioning: Upright in bed Baseline Vocal Quality: Normal;Low vocal intensity Volitional Cough: Strong (adequate) Volitional Swallow: Able to elicit    Oral/Motor/Sensory Function Overall Oral Motor/Sensory Function: Appears within functional limits for tasks assessed (pt had limited follow through w/ some oral motor movements) Labial ROM: Within Functional Limits Labial Symmetry: Within Functional Limits Lingual ROM: Within Functional Limits Lingual Symmetry: Within Functional Limits Lingual Strength: Within Functional Limits Facial Symmetry: Within Functional Limits Velum: Within Functional Limits   Ice Chips Ice chips: Within functional limits Presentation: Spoon (fed by SLP x3 trials)   Thin Liquid Thin Liquid: Within functional limits Presentation: Cup;Straw (required assistance to hold cup w/ her - shaky; 8 trials)    Nectar Thick Nectar Thick Liquid: Not tested   Honey Thick Honey Thick Liquid: Not tested   Puree  Puree: Within functional limits Presentation: Self Fed;Spoon (assisted the feeding; 4 ozs)   Solid   GO    Solid: Within functional limits Presentation: Spoon (fed by SLP; 1 trial of fruit)       Watson,Katherine 04/11/2015,1:49 PM

## 2015-04-11 NOTE — Telephone Encounter (Signed)
PLEASE NOTE: All timestamps contained within this report are represented as [place] Standard Time. CONFIDENTIALTY NOTICE: This fax transmission is intended only for the addressee. It contains information that is legally privileged, confidential or otherwise protected from use or disclosure. If you are not the intended recipient, you are strictly prohibited from reviewing, disclosing, copying using or disseminating any of this information or taking any action in reliance on or regarding this information. If you have received this fax in error, please notify us immediately by telephone so that we can arrange for its return to us. Phone: [phone], Toll-Free: [phone], Fax: [phone] Page: 1 of 1 Call Id: [id number] [facility] Night - Client TELEPHONE ADVICE RECORD [facility] Patient Name: [name] Gender: Female DOB: 1913/02/02 Age: [age] Y 8 M 24 D Return Phone Number: Address: City/State/Zip: [place] StatisticianClient [facility] - Night Physician Tillman AbideLetvak, Richard Contact Type Call Call Type Page Only Caller Name Judeth CornfieldStephanie Relationship To Patient Provider Is this call to report lab results? No Return Phone Number Please choose phone number Initial Comment Caller states Judeth CornfieldStephanie and RN at [facility] she needs an order for patient. Wants to update Dr on patients status. Patient is being sent out. CB# [phone] Nurse Assessment Guidelines Guideline Title Affirmed Question Affirmed Notes Nurse Date/Time Lamount Cohen(Eastern Time) Disp. Time Lamount Cohen(Eastern Time) Disposition Final User 04/15/2015 3:32:28 PM [facility] Paging Delmer IslamQueue Jones, Cassandra 04/11/2015 3:49:52 PM Paged On Call back to Call Center - PC Ulice DashScott, Ashley 04/18/2015 4:01:06 PM Page Completed Yes Ulice DashScott, Ashley After Care Instructions Given Call Event Type User Date / Time Description Comments User: Ulice DashAshley, Scott  Date/Time Lamount Cohen(Eastern Time): 04/09/2015 4:00:54 PM Spoke with on-call and tried to connect with facility but there was no answer. On-call advised if the caller calls back to connect her with caller on her home phone number [phone]. Paging DoctorName Phone DateTime Result/Outcome Message Type Notes Danise EdgeBlyth, Stacey [phone] 04/25/2015 3:49:52 PM Paged On Call Back to Call Center Doctor Paged Please call Morrie Sheldonshley at [phone]. Danise EdgeBlyth, Stacey 04/04/2015 4:02:37 PM Spoke with On Call - General Message Result

## 2015-04-11 NOTE — Telephone Encounter (Signed)
Pt seen 04/13/2015 at [facility] and admitted.

## 2015-04-11 NOTE — Clinical Social Work Note (Signed)
Clinical Social Work Assessment  Patient Details  Name: Lindsay Branch MRN: [id number] Date of Birth: 1913-01-08  Date of referral:  04/11/15               Reason for consult:  Facility Placement                Permission sought to share information with:    Permission granted to share information::     Name::        Agency::     Relationship::     Contact Information:     Housing/Transportation Living arrangements for the past 2 months:  Skilled Building surveyorursing Facility Source of Information:  [employer] Patient Interpreter Needed:  None Criminal Activity/Legal Involvement Pertinent to Current Situation/Hospitalization:  No - Comment as needed Significant Relationships:  Friend Lives with:  Facility Resident Do you feel safe going back to the place where you live?  Yes Need for family participation in patient care:  No (Coment)  Care giving concerns:  none   Office managerocial Worker assessment / plan:  Patient resides as a long term care patient at [facility]. This was confirmed by Lindsay LushAndrea [facility]. Patient's responsible party and POA, Wetzel BjornstadMarsha Branch:: [phone], was contacted via phone this morning and she confirmed that she wants patient to return [facility] when time.   Employment status:  Retired Database administratornsurance information:  Managed Medicare PT Recommendations:  Not assessed at this time Information / Referral to community resources:     Patient/Family's Response to care:  Adult nurseAppreciative of CSW assistance  Patient/Family's Understanding of and Emotional Response to Diagnosis, Current Treatment, and Prognosis:  Pleasant and cooperative  Emotional Assessment Appearance:  Appears stated age Attitude/Demeanor/Rapport:    Affect (typically observed):    Orientation:  Oriented to Self Alcohol / Substance use:  Not Applicable Psych involvement (Current and /or in the community):  No (Comment)  Discharge Needs  Concerns to be addressed:  No discharge needs  identified Readmission within the last 30 days:  No Current discharge risk:  None Barriers to Discharge:  No Barriers Identified   York SpanielMonica Naima Veldhuizen, LCSW 04/11/2015, 11:26 AM

## 2015-04-11 NOTE — Telephone Encounter (Signed)
Diagnosed with pneumonia and admitted I will follow along with her progress

## 2015-04-11 NOTE — Progress Notes (Signed)
Assessment completed.  SL lt fa flushes well.  IVF/antibiotics infusing well rt wrist.  No distress on 3LO2 per [facility].  Lugns diminished bil.  Pt oriented to person and year, reoriented to situation.  Denies pain at this time.  Fall precautions in place, bed alarm on, sr up x 2.

## 2015-04-11 NOTE — Progress Notes (Signed)
[facility] [place] [facility]   PATIENT NAME: Lindsay SheldonCarolyn Branch    MR#:  [id number]  DATE OF BIRTH:  Apr 22, 1913  SUBJECTIVE:  CHIEF COMPLAINT:   Chief Complaint  Patient presents with  . Shortness of Breath    Alert and on nasal canula oxygen.  REVIEW OF SYSTEMS:  Pt is confused, probably have dementia also- so not able to give me ROS. ROS  DRUG ALLERGIES:   Allergies  Allergen Reactions  . Sucralfate Rash  . Tramadol     REACTION: hallucinations, paranoia  . Levofloxacin Rash    VITALS:  Blood pressure 144/55, pulse 77, temperature 97.7 F (36.5 C), temperature source Oral, resp. rate 22, height 5\' 3"  (1.6 m), weight 47.401 kg (104 lb 8 oz), SpO2 92 %.  PHYSICAL EXAMINATION:  GENERAL:  81 y.o.-year-old patient lying in the bed with no acute distress.  EYES: Pupils equal, round, reactive to light and accommodation. No scleral icterus. Extraocular muscles intact.  HEENT: Head atraumatic, normocephalic. Oropharynx and nasopharynx clear.  NECK:  Supple, no jugular venous distention. No thyroid enlargement, no tenderness.  LUNGS: Normal breath sounds bilaterally,mild crepitation. No use of accessory muscles of respiration.  CARDIOVASCULAR: S1, S2 normal. No murmurs, rubs, or gallops.  ABDOMEN: Soft, nontender, nondistended. Bowel sounds present. No organomegaly or mass.  EXTREMITIES: No pedal edema, cyanosis, or clubbing.  NEUROLOGIC: Cranial nerves II through XII are intact. Muscle strength 4/5 in all extremities. Sensation intact. Gait not checked. Generalized weakness. PSYCHIATRIC: The patient is alert and oriented x 3.  SKIN: No obvious rash, lesion, or ulcer.   Physical Exam LABORATORY PANEL:   CBC  Recent Labs Lab 04/11/15 0452  WBC 10.5  HGB 12.9  HCT 40.7  PLT 178   ------------------------------------------------------------------------------------------------------------------  Chemistries   Recent Labs Lab  19-Sep-2015 1637 04/11/15 0452  NA 136 141  K 3.9 3.9  CL 100* 107  CO2 29 31  GLUCOSE 111* 104*  BUN 19 15  CREATININE 0.79 0.68  CALCIUM 8.3* 8.1*  MG 1.7  --   AST 18  --   ALT 10*  --   ALKPHOS 97  --   BILITOT 0.4  --    ------------------------------------------------------------------------------------------------------------------  Cardiac Enzymes  Recent Labs Lab 19-Sep-2015 1637  TROPONINI <0.03   ------------------------------------------------------------------------------------------------------------------  RADIOLOGY:  Dg Chest Port 1 View  July 12, 2015   CLINICAL DATA:  81 year old female with shortness of breath.  EXAM: PORTABLE CHEST - 1 VIEW  COMPARISON:  09/13/2013 prior radiographs  FINDINGS: Cardiomegaly noted in this low volume film.  Bibasilar opacities are again identified likely representing atelectasis/ scarring.  There is no evidence of pulmonary edema, suspicious pulmonary nodule/mass, pleural effusion, or pneumothorax. No acute bony abnormalities are identified.  IMPRESSION: Cardiomegaly with bibasilar opacities again noted -likely atelectasis/ scarring.  .   Electronically Signed   By: Harmon PierJeffrey  Hu M.D.   On: 0October 11, 2016 17:40    ASSESSMENT AND PLAN:   1' 81 acute hypoxic respiratory failure secondary to bilateral pneumonia. Patient of likely has a healthy Associated pneumonia  Continue vancomycin and Zosyn,   Follow blood cultures, continue oxygen.   Will get swallow eval.   She is now on nasal canula oxygen.   Spoke to her HCPOA- she is a full code.  #2:  speech therapy evaluation due to her dementia and pneumonia. 3: GERD continue PPIs #4: History of depression continue mirtazapine. 5: History of glaucoma continue eyedrops.   All the records are reviewed and case  discussed with Care Management/Social Workerr. Management plans discussed with the patient, family and they are in agreement.  CODE STATUS: full TOTAL TIME TAKING CARE OF THIS PATIENT:  35 minutes.   More than 50% of the visit was spent in counseling/coordination of care  POSSIBLE D/C IN 2-3 DAYS, DEPENDING ON CLINICAL CONDITION.   Altamese Dilling M.D on 04/11/2015   Between 7am to 6pm - Pager - [phone]  After 6pm go to [email or web address] - password [email or web address] [facility]  [facility]  Office  [phone]  CC: Primary care physician; Tillman Abide, MD

## 2015-04-11 NOTE — Care Management (Signed)
[facility] where she has been under a long term care plan of treatment.  CSW is aware

## 2015-04-12 ENCOUNTER — Inpatient Hospital Stay: Payer: Medicare Other

## 2015-04-12 NOTE — Care Management Important Message (Signed)
Important Message  Patient Details  Name: Lindsay Branch MRN: [id number] Date of Birth: Sep 26, 1913   Medicare Important Message Given:  Yes-second notification given    Verita SchneidersKathy A Allmond 04/12/2015, 10:34 AM

## 2015-04-12 NOTE — Progress Notes (Signed)
[facility] [place] [facility]   PATIENT NAME: Lindsay Branch    MR#:  [id number]  DATE OF BIRTH:  1913-07-05  SUBJECTIVE:  CHIEF COMPLAINT:   Chief Complaint  Patient presents with  . Shortness of Breath  more drowsy today and hypoxic.  REVIEW OF SYSTEMS:  Pt is confused, drowsy and have dementia also- so not able to give me ROS. ROS  DRUG ALLERGIES:   Allergies  Allergen Reactions  . Sucralfate Rash  . Tramadol     REACTION: hallucinations, paranoia  . Levofloxacin Rash    VITALS:  Blood pressure 143/66, pulse 74, temperature 97.4 F (36.3 C), temperature source Oral, resp. rate 28, height 5\' 3"  (1.6 m), weight 47.401 kg (104 lb 8 oz), SpO2 89 %.  PHYSICAL EXAMINATION:  GENERAL:  81 y.o.-year-old patient lying in the bed with no acute distress.  EYES: Pupils equal, round, reactive to light and accommodation. No scleral icterus. Extraocular muscles intact.  HEENT: Head atraumatic, normocephalic. Oropharynx and nasopharynx clear.  NECK:  Supple, no jugular venous distention. No thyroid enlargement, no tenderness.  LUNGS: Normal breath sounds bilaterally,mild crepitation. No use of accessory muscles of respiration. On increased oxygen- not keeping mask on. CARDIOVASCULAR: S1, S2 normal. No murmurs, rubs, or gallops.  ABDOMEN: Soft, nontender, nondistended. Bowel sounds present. No organomegaly or mass.  EXTREMITIES: No pedal edema, cyanosis, or clubbing.  NEUROLOGIC:  She is drowsy, opens eyes to stimuli, but not following commands. PSYCHIATRIC: drowsy. SKIN: No obvious rash, lesion, or ulcer.   Physical Exam LABORATORY PANEL:   CBC  Recent Labs Lab 04/11/15 0452  WBC 10.5  HGB 12.9  HCT 40.7  PLT 178   ------------------------------------------------------------------------------------------------------------------  Chemistries   Recent Labs Lab 04/22/2015 1637 04/11/15 0452  NA 136 141  K 3.9 3.9  CL 100* 107  CO2 29  31  GLUCOSE 111* 104*  BUN 19 15  CREATININE 0.79 0.68  CALCIUM 8.3* 8.1*  MG 1.7  --   AST 18  --   ALT 10*  --   ALKPHOS 97  --   BILITOT 0.4  --    ------------------------------------------------------------------------------------------------------------------  Cardiac Enzymes  Recent Labs Lab 04/09/2015 1637  TROPONINI <0.03   ------------------------------------------------------------------------------------------------------------------  RADIOLOGY:  Dg Chest 2 View  04/12/2015   CLINICAL DATA:  Pneumonia.  History of hypertension.  EXAM: CHEST  2 VIEW  COMPARISON:  04/09/2015  FINDINGS: Heart is enlarged. There are increasing infiltrates bilaterally, right greater than left. Confluent opacity developing at the right lung base. There are bilateral pleural effusions, increased on the right. Confluent opacity again noted at the left lung base which obscures the left hemidiaphragm.  IMPRESSION: Increasing bilateral pulmonary infiltrates consistent with edema and/or infectious process.   Electronically Signed   By: Norva PavlovElizabeth  Brown M.D.   On: 04/12/2015 10:53   Dg Chest Port 1 View  04/29/2015   CLINICAL DATA:  81 year old female with shortness of breath.  EXAM: PORTABLE CHEST - 1 VIEW  COMPARISON:  09/13/2013 prior radiographs  FINDINGS: Cardiomegaly noted in this low volume film.  Bibasilar opacities are again identified likely representing atelectasis/ scarring.  There is no evidence of pulmonary edema, suspicious pulmonary nodule/mass, pleural effusion, or pneumothorax. No acute bony abnormalities are identified.  IMPRESSION: Cardiomegaly with bibasilar opacities again noted -likely atelectasis/ scarring.  .   Electronically Signed   By: Harmon PierJeffrey  Hu M.D.   On: 04/05/2015 17:40    ASSESSMENT AND PLAN:   1' acute hypoxic  respiratory failure secondary to bilateral pneumonia. Patient of likely has a healthy Associated pneumonia  Continue vancomycin and Zosyn,   Follow blood  cultures, continue oxygen.   appreciated swallow eval.   She has worsening of condition today- Xray looks worse- need more oxygen.    High risk for further worsening- she is a full code and may require ventilator.   Spoke to her HCPOA Rinaldo Cloud- listed under emergency contact), explained her about her critical condition, she will discuss with her husband and let us  know if changes code status- meanwhile she is a full code.   Called pulmonary consult.  #2:  speech therapy evaluation due to her dementia and pneumonia. 3: GERD continue PPIs #4: History of depression continue mirtazapine. 5: History of glaucoma continue eyedrops.   All the records are reviewed and case discussed with Care Management/Social Workerr. Management plans discussed with the patient, family and they are in agreement.  CODE STATUS: full TOTAL TIME TAKING CARE OF THIS PATIENT: 45 minutes critical care.   More than 50% of the visit was spent in counseling/coordination of care  POSSIBLE D/C IN 2-3 DAYS, DEPENDING ON CLINICAL CONDITION.   Altamese Dilling M.D on 04/12/2015   Between 7am to 6pm - Pager - [phone]  After 6pm go to [email or web address] - password [email or web address] [facility]  [facility]  Office  [phone]  CC: Primary care physician; Tillman Abide, MD

## 2015-04-12 NOTE — Progress Notes (Signed)
Pt. Found on RA(mask lying beside her head). Sa02 was 81%. During treatment Sa02 was 94%. After treatment she was placed on 3L nasal cannula and Sa02 was 98%.

## 2015-04-12 NOTE — Plan of Care (Signed)
Problem: Discharge Progression Outcomes Goal: Discharge plan in place and appropriate Outcome: Progressing O2 sat 90% on 5L O2 via [place]. Dyspnea on exertion. Scheduled Duo-Nebs. OOB to Recliner with 2 person Assist. Pivots Only. Remains on Zosyn and Vancomycin IV Antibiotics.

## 2015-04-13 LAB — URINALYSIS COMPLETE WITH MICROSCOPIC (ARMC ONLY)
BILIRUBIN URINE: NEGATIVE
GLUCOSE, UA: NEGATIVE mg/dL
HGB URINE DIPSTICK: NEGATIVE
Ketones, ur: NEGATIVE mg/dL
Nitrite: NEGATIVE
PROTEIN: 30 mg/dL — AB
SPECIFIC GRAVITY, URINE: 1.027 (ref 1.005–1.030)
pH: 5 (ref 5.0–8.0)

## 2015-04-13 MED ORDER — HYDRALAZINE HCL 20 MG/ML IJ SOLN
10.0000 mg | Freq: Four times a day (QID) | INTRAMUSCULAR | Status: DC | PRN
  Administered 2015-04-14: 10 mg via INTRAVENOUS
  Filled 2015-04-13: qty 1

## 2015-04-13 MED ORDER — ENSURE ENLIVE PO LIQD
237.0000 mL | Freq: Two times a day (BID) | ORAL | Status: DC
  Administered 2015-04-13 – 2015-04-17 (×7): 237 mL via ORAL

## 2015-04-13 MED ORDER — METOPROLOL TARTRATE 25 MG PO TABS
25.0000 mg | ORAL_TABLET | Freq: Two times a day (BID) | ORAL | Status: DC
  Administered 2015-04-13 – 2015-04-18 (×9): 25 mg via ORAL
  Filled 2015-04-13 (×10): qty 1

## 2015-04-13 MED ORDER — ALBUTEROL SULFATE (2.5 MG/3ML) 0.083% IN NEBU: 2.5000 mg | INHALATION_SOLUTION | Freq: Four times a day (QID) | RESPIRATORY_TRACT | Status: DC | PRN

## 2015-04-13 NOTE — Progress Notes (Signed)
[facility] - [place] at [facility]   PATIENT NAME: Lindsay Branch    MR#:  [id number]  DATE OF BIRTH:  1913-05-13  SUBJECTIVE:  CHIEF COMPLAINT:   Chief Complaint  Patient presents with  . Shortness of Breath  more drowsy today and hypoxic.  REVIEW OF SYSTEMS:  Pt is confused, alert, but demented.Marland Kitchen ROS  DRUG ALLERGIES:   Allergies  Allergen Reactions  . Sucralfate Rash  . Tramadol     REACTION: hallucinations, paranoia  . Levofloxacin Rash    VITALS:  Blood pressure 151/63, pulse 89, temperature 98.1 F (36.7 C), temperature source Oral, resp. rate 22, height  (1.6 m), weight 47.401 kg (104 lb 8 oz), SpO2 95 %.  PHYSICAL EXAMINATION:  GENERAL:  81 y.o.-year-old patient lying in the bed with no acute distress.  EYES: Pupils equal, round, reactive to light and accommodation. No scleral icterus. Extraocular muscles intact.  HEENT: Head atraumatic, normocephalic. Oropharynx and nasopharynx clear.  NECK:  Supple, no jugular venous distention. No thyroid enlargement, no tenderness.  LUNGS: Normal breath sounds bilaterally,mild crepitation. No use of accessory muscles of respiration. On increased oxygen 4-5 ltr, but better than yesterday. CARDIOVASCULAR: S1, S2 normal. No murmurs, rubs, or gallops.  ABDOMEN: Soft, nontender, nondistended. Bowel sounds present. No organomegaly or mass.  EXTREMITIES: No pedal edema, cyanosis, or clubbing.  NEUROLOGIC:  She is drowsy, opens eyes to stimuli, but not following commands. PSYCHIATRIC: drowsy. SKIN: No obvious rash, lesion, or ulcer.   Physical Exam LABORATORY PANEL:   CBC  Recent Labs Lab 04/11/15 0452  WBC 10.5  HGB 12.9  HCT 40.7  PLT 178   ------------------------------------------------------------------------------------------------------------------  Chemistries   Recent Labs Lab 04/13/2015 1637 04/11/15 0452  NA 136 141  K 3.9 3.9  CL 100* 107  CO2 29 31  GLUCOSE 111* 104*   BUN 19 15  CREATININE 0.79 0.68  CALCIUM 8.3* 8.1*  MG 1.7  --   AST 18  --   ALT 10*  --   ALKPHOS 97  --   BILITOT 0.4  --    ------------------------------------------------------------------------------------------------------------------  Cardiac Enzymes  Recent Labs Lab 04/17/2015 1637  TROPONINI <0.03   ------------------------------------------------------------------------------------------------------------------  RADIOLOGY:  Dg Chest 2 View  04/12/2015   CLINICAL DATA:  Pneumonia.  History of hypertension.  EXAM: CHEST  2 VIEW  COMPARISON:  04/01/2015  FINDINGS: Heart is enlarged. There are increasing infiltrates bilaterally, right greater than left. Confluent opacity developing at the right lung base. There are bilateral pleural effusions, increased on the right. Confluent opacity again noted at the left lung base which obscures the left hemidiaphragm.  IMPRESSION: Increasing bilateral pulmonary infiltrates consistent with edema and/or infectious process.   Electronically Signed   By: Norva Pavlov M.D.   On: 04/12/2015 10:53    ASSESSMENT AND PLAN:   1' acute hypoxic respiratory failure secondary to bilateral pneumonia. Patient of likely has a healthy Associated pneumonia  Continue vancomycin and Zosyn,   Follow blood cultures- negative so far, continue oxygen.   appreciated swallow eval.   She had worsening of condition yesterday- Xray looks worse- needed more oxygen.    She improved today and more calm and alert.    Will continue the treatment.  #2:  speech therapy evaluation due to her dementia and pneumonia. #3: GERD continue PPIs # 4: History of depression continue mirtazapine. # 5: History of glaucoma continue eyedrops. #6. Recent UTI- as per care taker she had UTI last week- I  will check UA.  All the records are reviewed and case discussed with Care Management/Social Workerr. Management plans discussed with the patient, family and they are in  agreement.  CODE STATUS: full TOTAL TIME TAKING CARE OF THIS PATIENT: 35 minutes.   More than 50% of the visit was spent in counseling/coordination of care  POSSIBLE D/C IN 2-3 DAYS, DEPENDING ON CLINICAL CONDITION.   Altamese DillingVACHHANI, Conchetta Lamia M.D on 04/13/2015   Between 7am to 6pm - Pager - [phone]  After 6pm go to [email or web address] - password [email or web address] [facility]  [facility]  Office  [phone]  CC: Primary care physician; Tillman Abideichard Letvak, MD

## 2015-04-13 NOTE — Progress Notes (Signed)
Initial Nutrition Assessment    INTERVENTION:   Meals and Snacks: Cater to patient preferences Medical Food Supplement Therapy: recommend Ensure Enlive po BID, each supplement provides 350 kcal and 20 grams of protein   NUTRITION DIAGNOSIS:   Inadequate oral intake related to chronic illness as evidenced by mild depletion of muscle mass, moderate depletions of muscle mass, per patient/family report.  GOAL:   Patient will meet greater than or equal to 90% of their needs   MONITOR:    (Energy Intake, Anthropometrics, Electrolyte/Renal Profile, Digestive System)  REASON FOR ASSESSMENT:   Consult Assessment of nutrition requirement/status  ASSESSMENT:   Pt admitted with acute respiratory failure with bilateral pneumonia; pt alert, confused on visit today  Past Medical History  Diagnosis Date  . GERD (gastroesophageal reflux disease)     with past esophageal dilatation  . Hypertension   . Osteoporosis   . Anemia   . Depression   . TIA (transient ischemic attack)   . Urinary incontinence   . Pulmonary hypertension   . Metabolic encephalopathy   . Dementia      Diet Order:  DIET DYS 3 Room service appropriate?: Yes; Fluid consistency:: Thin  Energy Intake:  Recorded po intake 25% at dinner last night, 100% at lunch yesterday. Pt reports she ate breakfast this AM  Electrolyte and Renal Profile:  Recent Labs Lab 04/22/2015 1637 04/11/15 0452  BUN 19 15  CREATININE 0.79 0.68  NA 136 141  K 3.9 3.9  MG 1.7  --    Protein Profile:   Recent Labs Lab 04/03/2015 1637  ALBUMIN 3.3*     Skin:  Reviewed, no issues  Last BM:  7/12   Nutrition Focused Physical Exam: Nutrition-Focused physical exam completed. Findings are wdl fat depletion, mild/moderate muscle depletion in all areas, and mild edema.   Meds: NS at 50 ml/hr, remeron, senokot  Height:   Ht Readings from Last 1 Encounters:  04/09/2015 5\' 3"  (1.6 m)    Weight:   Wt Readings from Last 1  Encounters:  04/04/2015 104 lb 8 oz (47.401 kg)   Filed Weights   04/18/2015 1631 04/16/2015 2028  Weight: 106 lb 2 oz (48.138 kg) 104 lb 8 oz (47.401 kg)   Weight Changes: pt unsure if weight has gone up or down; per wt readings from last 10 encounters, weight up since last admission 3 years ago    Wt Readings from Last 10 Encounters:  04/01/2015 104 lb 8 oz (47.401 kg)  04/17/12 98 lb (44.453 kg)  01/17/12 95 lb (43.092 kg)  10/25/11 95 lb (43.092 kg)  09/27/11 100 lb (45.36 kg)  06/21/11 99 lb (44.906 kg)  04/05/11 100 lb (45.36 kg)  01/04/11 102 lb 8 oz (46.494 kg)  09/14/10 110 lb (49.896 kg)  06/22/10 109 lb (49.442 kg)    BMI:  Body mass index is 18.52 kg/(m^2).   EDUCATION NEEDS:   Education needs no appropriate at this time  MODERATE Care Level  Romelle StarcherCate Larron Armor MS, RD, LDN [phone] Pager

## 2015-04-13 NOTE — Progress Notes (Signed)
Speech Language Pathology Treatment: Dysphagia  Patient Details Name: Lindsay Branch MRN: [id number] DOB: Mar 05, 1913 Today's Date: 04/13/2015 Time: 2707-8675 SLP Time Calculation (min) (ACUTE ONLY): 45 min  Assessment / Plan / Recommendation Clinical Impression  Pt appears to be tolerating her current Dys. 3 w/ thin liquids diet w/ no overt s/s of aspiration per staff/pt report and no decline in medical/respiratory status in the past 1-2 days while on this diet. No significant oral phase deficits noted w/ mech soft diet. Pt able to feed self w/ setup and support. Pt has been eating/drinking at each meal but does endorse a decreased appetite. Family/caregiver member stated pt does drink Ensure at her NH d/t baseline, decreased appetite. Rec. Continue w/ current diet consistency w/ aspiration precautions; meds in puree as nec. Education given during session to pt and caregiver. No further skilled ST services indicated at this time. NSG to reconsult if any changes in status.    HPI Other Pertinent Information: Pt is a 81 y.o. female with a known history of CHF, pulmonary hypertension, dementia brought in from [facility] [place] secondary to respiratory distressed and tachypnea. Patient to O2 saturations were 70% at [facility] [place]. Patient placed on 4 L of nasal cannula and the EMS was called and when EMS arrived patient has tachypnea with respiratory rate of 40 and hypoxemia. Patient placed on nonrebreather and saturations went up to 100. When she came to emergency room patient has significant tachypnea and the muscles retraction. Patient to O2 saturations improved to 90% on 4 L. Patient chest x-ray was concerning for pneumonia. According to the family patient started on oxygen for the past one and half week because of the same problem. Patient has no fever has some cough. NSG reported pt had no difficulty swallowing pills w/ water and eating some at each meal; noted several bites of lunch meal eaten. A  friend/caregiver present assisting her w/ eating/drinking d/t overall weakness. He stated he felt she was "not eating enough" and said that pt takes Ensure at [facility].     Pertinent Vitals Pain Assessment: No/denies pain  SLP Plan  All goals met    Recommendations Diet recommendations: Dysphagia 3 (mechanical soft);Thin liquid Liquids provided via: Cup;Straw Medication Administration: Whole meds with puree Supervision: Patient able to self feed;Intermittent supervision to cue for compensatory strategies Compensations: Slow rate;Small sips/bites Postural Changes and/or Swallow Maneuvers: Seated upright 90 degrees              General recommendations:  (f/u w/ Dietician) Oral Care Recommendations: Oral care BID;Oral care before and after PO;Staff/trained caregiver to provide oral care Follow up Recommendations:  (TBD by PT) Plan: All goals met    GO     Watson,Katherine 04/13/2015, 1:38 PM

## 2015-04-14 LAB — BASIC METABOLIC PANEL
Anion gap: 7 (ref 5–15)
BUN: 12 mg/dL (ref 6–20)
CHLORIDE: 108 mmol/L (ref 101–111)
CO2: 28 mmol/L (ref 22–32)
CREATININE: 0.79 mg/dL (ref 0.44–1.00)
Calcium: 8.4 mg/dL — ABNORMAL LOW (ref 8.9–10.3)
GFR calc Af Amer: >60 mL/min (ref >60)
GLUCOSE: 103 mg/dL — AB (ref 65–99)
POTASSIUM: 4.2 mmol/L (ref 3.5–5.1)
Sodium: 143 mmol/L (ref 135–145)

## 2015-04-14 LAB — CBC
HEMATOCRIT: 36.3 % (ref 35.0–47.0)
Hemoglobin: 11.7 g/dL — ABNORMAL LOW (ref 12.0–16.0)
MCH: 30.2 pg (ref 26.0–34.0)
MCHC: 32.3 g/dL (ref 32.0–36.0)
MCV: 93.6 fL (ref 80.0–100.0)
PLATELETS: 183 10*3/uL (ref 150–440)
RBC: 3.88 MIL/uL (ref 3.80–5.20)
RDW: 14.5 % (ref 11.5–14.5)
WBC: 8.3 10*3/uL (ref 3.6–11.0)

## 2015-04-14 LAB — VANCOMYCIN, TROUGH: Vancomycin Tr: 9 ug/mL — ABNORMAL LOW (ref 10–20)

## 2015-04-14 MED ORDER — BUDESONIDE 0.25 MG/2ML IN SUSP
0.2500 mg | Freq: Two times a day (BID) | RESPIRATORY_TRACT | Status: DC
  Administered 2015-04-14 – 2015-04-18 (×8): 0.25 mg via RESPIRATORY_TRACT
  Filled 2015-04-14 (×8): qty 2

## 2015-04-14 MED ORDER — IPRATROPIUM-ALBUTEROL 0.5-2.5 (3) MG/3ML IN SOLN
3.0000 mL | Freq: Four times a day (QID) | RESPIRATORY_TRACT | Status: DC
  Administered 2015-04-14 – 2015-04-19 (×18): 3 mL via RESPIRATORY_TRACT
  Filled 2015-04-14 (×18): qty 3

## 2015-04-14 MED ORDER — AMOXICILLIN-POT CLAVULANATE 400-57 MG/5ML PO SUSR
875.0000 mg | Freq: Two times a day (BID) | ORAL | Status: DC
  Administered 2015-04-14 – 2015-04-15 (×3): 872 mg via ORAL
  Filled 2015-04-14 (×4): qty 10.9

## 2015-04-14 NOTE — Care Management Important Message (Signed)
Important Message  Patient Details  Name: Lindsay SpecterCarolyn B Abaya MRN: [id number] Date of Birth: 1913/08/06   Medicare Important Message Given:  Yes-third notification given    Olegario MessierKathy A Allmond 04/14/2015, 9:45 AM

## 2015-04-14 NOTE — Progress Notes (Signed)
[facility] [place] [facility]   PATIENT NAME: Lindsay Branch    MR#:  [id number]  DATE OF BIRTH:  1913/03/26  SUBJECTIVE:  CHIEF COMPLAINT:   Chief Complaint  Patient presents with  . Shortness of Breath  more drowsy today and hypoxic again.  REVIEW OF SYSTEMS:  Pt is confused,  demented. ROS  DRUG ALLERGIES:   Allergies  Allergen Reactions  . Sucralfate Rash  . Tramadol     REACTION: hallucinations, paranoia  . Levofloxacin Rash    VITALS:  Blood pressure 130/63, pulse 70, temperature 98.3 F (36.8 C), temperature source Oral, resp. rate 17, height 5\' 3"  (1.6 m), weight 47.401 kg (104 lb 8 oz), SpO2 97 %.  PHYSICAL EXAMINATION:  GENERAL:  81 y.o.-year-old patient lying in the bed with no acute distress.  EYES: Pupils equal, round, reactive to light and accommodation. No scleral icterus. Extraocular muscles intact.  HEENT: Head atraumatic, normocephalic. Oropharynx and nasopharynx clear.  NECK:  Supple, no jugular venous distention. No thyroid enlargement, no tenderness.  LUNGS: Normal breath sounds bilaterally,mild crepitation. No use of accessory muscles of respiration. On increased oxygen 4-5 ltr, but better than yesterday. CARDIOVASCULAR: S1, S2 normal. No murmurs, rubs, or gallops.  ABDOMEN: Soft, nontender, nondistended. Bowel sounds present. No organomegaly or mass.  EXTREMITIES: No pedal edema, cyanosis, or clubbing.  NEUROLOGIC:  She is drowsy, opens eyes to stimuli, but not following commands. PSYCHIATRIC: drowsy. SKIN: No obvious rash, lesion, or ulcer.   Physical Exam LABORATORY PANEL:   CBC  Recent Labs Lab 04/14/15 0441  WBC 8.3  HGB 11.7*  HCT 36.3  PLT 183   ------------------------------------------------------------------------------------------------------------------  Chemistries   Recent Labs Lab 04/11/2015 1637  04/14/15 0441  NA 136  < > 143  K 3.9  < > 4.2  CL 100*  < > 108  CO2 29  < > 28   GLUCOSE 111*  < > 103*  BUN 19  < > 12  CREATININE 0.79  < > 0.79  CALCIUM 8.3*  < > 8.4*  MG 1.7  --   --   AST 18  --   --   ALT 10*  --   --   ALKPHOS 97  --   --   BILITOT 0.4  --   --   < > = values in this interval not displayed. ------------------------------------------------------------------------------------------------------------------  Cardiac Enzymes  Recent Labs Lab 04/04/2015 1637  TROPONINI <0.03   ------------------------------------------------------------------------------------------------------------------  RADIOLOGY:  No results found.  ASSESSMENT AND PLAN:   1' acute hypoxic respiratory failure secondary to bilateral pneumonia. Patient of likely has a healthy Associated pneumonia  Continue vancomycin and Zosyn,    Follow blood cultures- negative so far, continue oxygen.   appreciated swallow eval.   She had worsening of condition yesterday- Xray looks worse- needed more oxygen.    Change Abx to Augmentin liquid, as she might have good and bad days on-off.     I tried to convince HCPOA to convert pt to hospice care- but she is a full code.    So we will continue therapy till she improves. Not ready for discharge due to lethargy, hypoxia.    #2:  speech therapy evaluation due to her dementia and pneumonia. #3: GERD continue PPIs # 4: History of depression continue mirtazapine. # 5: History of glaucoma continue eye drops. #6. Recent UTI- as per care taker she had UTI last week- I will check UA.  All the records  are reviewed and case discussed with Care Management/Social Workerr. Management plans discussed with the patient, family and they are in agreement.  CODE STATUS: full TOTAL TIME TAKING CARE OF THIS PATIENT: 35 minutes.   More than 50% of the visit was spent in counseling/coordination of care  POSSIBLE D/C IN 2-3 DAYS, DEPENDING ON CLINICAL CONDITION.   Altamese Dilling M.D on 04/14/2015   Between 7am to 6pm - Pager -  [phone]  After 6pm go to [email or web address] - password [email or web address] [facility]  [facility]  Office  [phone]  CC: Primary care physician; Tillman Abide, MD

## 2015-04-15 ENCOUNTER — Inpatient Hospital Stay: Payer: Medicare Other

## 2015-04-15 LAB — CULTURE, BLOOD (ROUTINE X 2)
Culture: NO GROWTH
Culture: NO GROWTH

## 2015-04-15 LAB — BODY FLUID CELL COUNT WITH DIFFERENTIAL
Eos, Fluid: 8 %
Lymphs, Fluid: 36 %
Monocyte-Macrophage-Serous Fluid: 4 %
Neutrophil Count, Fluid: 52 %
OTHER CELLS FL: 0 %
Total Nucleated Cell Count, Fluid: 55 cu mm

## 2015-04-15 LAB — PROTEIN, BODY FLUID: Total protein, fluid: <3 g/dL

## 2015-04-15 LAB — LACTATE DEHYDROGENASE, PLEURAL OR PERITONEAL FLUID: LD, Fluid: 67 U/L — ABNORMAL HIGH (ref 3–23)

## 2015-04-15 LAB — VANCOMYCIN, TROUGH: VANCOMYCIN TR: 6 ug/mL — AB (ref 10–20)

## 2015-04-15 LAB — GLUCOSE, SEROUS FLUID: Glucose, Fluid: 109 mg/dL

## 2015-04-15 MED ORDER — VANCOMYCIN HCL 500 MG IV SOLR
500.0000 mg | INTRAVENOUS | Status: DC
  Administered 2015-04-15 – 2015-04-18 (×4): 500 mg via INTRAVENOUS
  Filled 2015-04-15 (×5): qty 500

## 2015-04-15 MED ORDER — PIPERACILLIN-TAZOBACTAM 3.375 G IVPB
3.3750 g | Freq: Three times a day (TID) | INTRAVENOUS | Status: DC
  Administered 2015-04-15 – 2015-04-18 (×11): 3.375 g via INTRAVENOUS
  Filled 2015-04-15 (×16): qty 50

## 2015-04-15 NOTE — Progress Notes (Signed)
Date: 04/15/2015,   MRN# [id number] Lindsay Branch 01/16/15 Code Status:     Code Status Orders        Start     Ordered   04/15/2015 1825  Full code   Continuous     04/24/2015 1829     Hosp day:@LENGTHOFSTAYDAYS @ Referring MD: @     PCP:      AdmissionWeight: 106 lb 2 oz (48.138 kg)                 CurrentWeight: 104 lb 8 oz (47.401 kg)  CC: Respiratory failure  HPI: This is a 81 year old lady with a known history of CHF, pulmonary hypertension, dementia brought in from [facility] [place] secondary to respiratory distressed and tachypnea. Patient to O2 saturations were 70% at [facility] [place]. Patient placed on 4 L of nasal cannula and the EMS was called and when EMS arrived patient has tachypnea with respiratory rate of 40 and hypoxia. Since being here there is the  need to increase fio2. She is a full code. On w/u she has the appearance of multiple pneumonia, loculated right pleural effusion. Status post right thoracentesis. Only 15 cc of fluid removed due to the effusion being loculated.  LDH< 300, Prot < 3, gluc 109, WBC 55, neut 52, lymph 36. Cultures pending. Tonight still quit sob, venti mask on.   PMHX:   Past Medical History  Diagnosis Date  . GERD (gastroesophageal reflux disease)     with past esophageal dilatation  . Hypertension   . Osteoporosis   . Anemia   . Depression   . TIA (transient ischemic attack)   . Urinary incontinence   . Pulmonary hypertension   . Metabolic encephalopathy   . Dementia    Surgical Hx:  Past Surgical History  Procedure Laterality Date  . Abdominal hysterectomy      1 ovary left  . Perforated esophagus during dilation  1995  . Fracture surgery  1999    Hip  . Cholecystectomy  06/2008   Family Hx:  Family History  Problem Relation Age of Onset  . Cancer Mother     brain cancer  . Heart disease Father    Social Hx:   History  Substance Use Topics  . Smoking status: Never Smoker   . Smokeless tobacco: Never Used  .  Alcohol Use: No   Medication:    Home Medication:  No current outpatient prescriptions on file.  Current Medication: @   Allergies:  Sucralfate; Tramadol; and Levofloxacin  Review of Systems: Unable to give a consistent history  Physical Examination:   VS: BP 130/85 mmHg  Pulse 71  Temp(Src) 98 F (36.7 C) (Oral)  Resp 20  Ht  (1.6 m)  Wt 104 lb 8 oz (47.401 kg)  BMI 18.52 kg/m2  SpO2 97%  General Appearance: No distress  Neuropsych demented, speech normal, alert to name cranial nerves 2-12 intact, reflexes normal and symmetric, sensation grossly normal  HEENT: PERRLA, EOM intact, no ptosis, no other lesions noticed Neck: Cachetic, supple, nodes. Chest: coarse decrease breath sounds at the base right > left Cor:    Rrr, no murmur, gallop Abdomen:Benign, Soft, non-tender, No masses, hepatosplenomegaly, No lymphadenopathy Endoc: No evident thyromegaly, no signs of acromegaly or Cushing features Skin:   warm, no rashes, no ecchymosis  Extremities: normal, no cyanosis, clubbing, no edema, warm with normal capillary refill. Other findings:   Labs results:   Recent Labs     04/14/15  0441  HGB  11.7*  HCT  36.3  MCV  93.6  WBC  8.3  BUN  12  CREATININE  0.79  GLUCOSE  103*  CALCIUM  8.4*  ,    Culture results:  pending   Rad results:   Dg Chest 1 View  04/15/2015   CLINICAL DATA:  Status post right-sided thoracentesis.  EXAM: CHEST  1 VIEW  COMPARISON:  Same day.  FINDINGS: Stable cardiomegaly. Large hiatal hernia is noted. No pneumothorax is noted. Stable bibasilar opacities are noted concerning for pneumonia. Stable loculated right pleural effusion is seen along the right lateral chest wall. Bony thorax is intact.  IMPRESSION: No pneumothorax status post right-sided thoracentesis. Stable loculated pleural effusion seen along right lateral chest wall. Stable bibasilar opacities compared to prior exam. Moderate hiatal hernia is noted.    Electronically Signed   By: Lupita RaiderJames  Green Jr, M.D.   On: 04/15/2015 17:01   Dg Chest 2 View  04/15/2015   CLINICAL DATA:  81 year old female with shortness of breath. Pneumonia.  EXAM: CHEST  2 VIEW  COMPARISON:  Chest x-ray 04/12/2015.  FINDINGS: Again noted is diffuse peribronchial cuffing, and interstitial prominence, and patchy airspace opacities throughout the lungs bilaterally. The airspace disease is most confluent in the lung bases bilaterally and in the right mid lung. Increasing moderate loculated right pleural effusion. Small left pleural effusion. New area of band-like atelectasis in the left mid lung. Fluid tracking in the minor fissure. Mild cephalization of the pulmonary vasculature, without frank pulmonary edema. Heart size is mildly enlarged, but similar to prior studies, and likely accentuated by patient's rotation to the left which also distorts upper mediastinal contours. Atherosclerosis in the thoracic aorta.  IMPRESSION: 1. Findings remain most concerning for multilobar pneumonia, with increasing moderate partially loculated right-sided pleural effusion. 2. Small left pleural effusion. 3. Mild cardiomegaly. 4. Atherosclerosis.   Electronically Signed   By: Trudie Reedaniel  Entrikin M.D.   On: 04/15/2015 07:55   [place] Thoracentesis Asp Pleural Space W/img Guide  04/15/2015   CLINICAL DATA:  Right pleural effusion.  EXAM: ULTRASOUND GUIDED right THORACENTESIS  COMPARISON:  Chest radiograph of same date.  PROCEDURE: Informed consent was obtained from the patient's power of attorney.  Ultrasound was performed to localize and mark an adequate pocket of fluid in the right chest. The area was then prepped and draped in the normal sterile fashion. 1% Lidocaine was used for local anesthesia. Under ultrasound guidance a Safe-T-Centesis catheter was introduced. Thoracentesis was performed. The catheter was removed and a dressing applied.  Complications:  None immediate.  FINDINGS: A total of approximately 15 mL  of serous fluid was removed. A fluid sample wassent for laboratory analysis. Only a small amount of fluid was obtained due to the extremely loculated nature of this effusion.  IMPRESSION: Successful ultrasound guided right thoracentesis yielding 15 mL of pleural fluid.   Electronically Signed   By: Lupita RaiderJames  Green Jr, M.D.   On: 04/15/2015 16:54      Assessment and Plan: Here with hypoxia, deconditioned, pneumonia, multifocal, with loculated pleural effusion, that appears to be transudative.  Agree with vanco and zosyn, wean fio2 as tolerated, if we need higher fio2 consider HFNC 02, followed by bipap etc. ? Chest tube, following    I have personally obtained a history, examined the patient, evaluated laboratory and imaging results, formulated the assessment and plan and placed orders.  The Patient requires high complexity decision making for assessment and support, frequent evaluation and  titration of therapies, application of advanced monitoring technologies and extensive interpretation of multiple databases.   Bart Ashford,M.D. Pulmonary & Critical care Medicine [facility]

## 2015-04-15 NOTE — Progress Notes (Addendum)
ANTIBIOTIC CONSULT NOTE - INITIAL  Pharmacy Consult for Vancomycin and Zosyn Indication: HCAP  Allergies  Allergen Reactions  . Sucralfate Rash  . Tramadol     REACTION: hallucinations, paranoia  . Levofloxacin Rash    Patient Measurements: Height:  (160 cm) Weight: 104 lb 8 oz (47.401 kg) IBW/kg (Calculated) : 52.4 Adjusted Body Weight: n/a  Vital Signs: Temp: 97.6 F (36.4 C) (07/15 1139) Temp Source: Oral (07/15 1139) BP: 120/58 mmHg (07/15 1139) Pulse Rate: 70 (07/15 1139) Intake/Output from previous day: 07/14 0701 - 07/15 0700 In: 1294.2 [P.O.:120; I.V.:1174.2] Out: -  Intake/Output from this shift:    Labs:  Recent Labs  04/14/15 0441  WBC 8.3  HGB 11.7*  PLT 183  CREATININE 0.79   Estimated Creatinine Clearance: 28.7 mL/min (by C-G formula based on Cr of 0.79).  Recent Labs  04/14/15 1732  VANCOTROUGH 9*     Microbiology: Recent Results (from the past 720 hour(s))  Blood culture (routine x 2)     Status: None   Collection Time: 04/08/2015  5:43 PM  Result Value Ref Range Status   Specimen Description BLOOD LEFT ARM  Final   Special Requests BOTTLES DRAWN AEROBIC AND ANAEROBIC  Final   Culture NO GROWTH 5 DAYS  Final   Report Status 04/15/2015 FINAL  Final  Blood culture (routine x 2)     Status: None   Collection Time: 04/30/2015  5:55 PM  Result Value Ref Range Status   Specimen Description BLOOD LEFT ARM  Final   Special Requests   Final    BOTTLES DRAWN AEROBIC AND ANAEROBIC  AER 1CC ANA 2CC   Culture NO GROWTH 5 DAYS  Final   Report Status 04/15/2015 FINAL  Final  Urine culture     Status: None (Preliminary result)   Collection Time: 04/14/15  5:00 PM  Result Value Ref Range Status   Specimen Description URINE, CATHETERIZED  Final   Special Requests zosyn Normal  Final   Culture NO GROWTH < 24 HOURS  Final   Report Status PENDING  Incomplete    Medical History: Past Medical History  Diagnosis Date  . GERD  (gastroesophageal reflux disease)     with past esophageal dilatation  . Hypertension   . Osteoporosis   . Anemia   . Depression   . TIA (transient ischemic attack)   . Urinary incontinence   . Pulmonary hypertension   . Metabolic encephalopathy   . Dementia     Medications:  Anti-infectives    Start     Dose/Rate Route Frequency Ordered Stop   04/14/15 1430  amoxicillin-clavulanate (AUGMENTIN) 400-57 MG/5ML suspension 872 mg  Status:  Discontinued     875 mg of amoxicillin Oral Every 12 hours 04/14/15 1421 04/15/15 1443   04/11/15 1800  vancomycin (VANCOCIN) 500 mg in sodium chloride 0.9 % 100 mL IVPB  Status:  Discontinued     500 mg 100 mL/hr over 60 Minutes Intravenous Every 24 hours 04/14/2015 2057 04/14/15 1421   04/26/2015 2200  piperacillin-tazobactam (ZOSYN) IVPB 3.375 g  Status:  Discontinued     3.375 g 12.5 mL/hr over 240 Minutes Intravenous 3 times per day 04/21/2015 2048 04/14/15 1421   04/03/2015 1830  vancomycin (VANCOCIN) IVPB 1000 mg/200 mL premix  Status:  Discontinued     1,000 mg 200 mL/hr over 60 Minutes Intravenous Every 24 hours 04/05/2015 1829 04/09/2015 2057   04/18/2015 1830  piperacillin-tazobactam (ZOSYN) IVPB 3.375 g  Status:  Discontinued     3.375 g 100 mL/hr over 30 Minutes Intravenous 4 times per day 12/14/14 1829 12/14/14 2048   12/14/14 1815  piperacillin-tazobactam (ZOSYN) IVPB 3.375 g  Status:  Discontinued     3.375 g 12.5 mL/hr over 240 Minutes Intravenous  Once 12/14/14 1804 12/14/14 2048   12/14/14 1815  vancomycin (VANCOCIN) IVPB 1000 mg/200 mL premix    Comments:  HCAP   1,000 mg 200 mL/hr over 60 Minutes Intravenous  Once 12/14/14 1804 12/14/14 1921     Assessment: Patient admitted for possible HCAP. Patient was started on Vanc and Zosyn. Patient had improved after several days and vanc/zosyn was d/c yesterday afternoon and Augmentin started. Chest X-ray still shows pneumonia. Restarting on Vancomycin and Zosyn. Last vanc dose was given wed 7/13  @ 1759. Will check level prior to redosing. Restart Zosyn 3.375mg  q 8hr EI dosing.  Goal of Therapy:  Vancomycin trough level 15-20 mcg/ml  Plan:    Olene FlossMelissa D Jeimy Bickert, Pharm.D Clinical Pharmacist 04/15/2015 3:00 PM

## 2015-04-15 NOTE — Progress Notes (Signed)
Condition critical today- requiring Venti mask.  Xray done- worsening multilobar PNA and Moderate pleural effusion.  Spoke to family in detail. Discussed with Dr. Meredeth IdeFleming.  Plan: Pleural tap today. Re-started on Vanc + zosyn. If worsening in condition- may need ventilator- and family and HCPOA wants that.  FULL CODE.

## 2015-04-15 NOTE — Progress Notes (Signed)
Confused through the night but slept well. IVF infusing. No complaints. Possible DC today.

## 2015-04-15 NOTE — Progress Notes (Signed)
ANTIBIOTIC CONSULT NOTE - INITIAL  Pharmacy Consult for Vancomycin and Zosyn Indication: HCAP  Allergies  Allergen Reactions  . Sucralfate Rash  . Tramadol     REACTION: hallucinations, paranoia  . Levofloxacin Rash    Patient Measurements: Height:  (160 cm) Weight: 104 lb 8 oz (47.401 kg) IBW/kg (Calculated) : 52.4 Adjusted Body Weight: n/a  Vital Signs: Temp: 97.6 F (36.4 C) (07/15 1139) Temp Source: Oral (07/15 1139) BP: 155/77 mmHg (07/15 1640) Pulse Rate: 68 (07/15 1640) Intake/Output from previous day: 07/14 0701 - 07/15 0700 In: 1294.2 [P.O.:120; I.V.:1174.2] Out: -  Intake/Output from this shift: Total I/O In: 416.7 [I.V.:416.7] Out: 0   Labs:  Recent Labs  04/14/15 0441  WBC 8.3  HGB 11.7*  PLT 183  CREATININE 0.79   Estimated Creatinine Clearance: 28.7 mL/min (by C-G formula based on Cr of 0.79).  Ke: 0.028, t-1/2: 24.75 hrs, Vd: 33.2 L.   Recent Labs  04/14/15 1732 04/15/15 1539  VANCOTROUGH 9* 6*     Microbiology: Recent Results (from the past 720 hour(s))  Blood culture (routine x 2)     Status: None   Collection Time: 04/14/2015  5:43 PM  Result Value Ref Range Status   Specimen Description BLOOD LEFT ARM  Final   Special Requests BOTTLES DRAWN AEROBIC AND ANAEROBIC  Final   Culture NO GROWTH 5 DAYS  Final   Report Status 04/15/2015 FINAL  Final  Blood culture (routine x 2)     Status: None   Collection Time: 04/14/2015  5:55 PM  Result Value Ref Range Status   Specimen Description BLOOD LEFT ARM  Final   Special Requests   Final    BOTTLES DRAWN AEROBIC AND ANAEROBIC  AER 1CC ANA 2CC   Culture NO GROWTH 5 DAYS  Final   Report Status 04/15/2015 FINAL  Final  Urine culture     Status: None (Preliminary result)   Collection Time: 04/14/15  5:00 PM  Result Value Ref Range Status   Specimen Description URINE, CATHETERIZED  Final   Special Requests zosyn Normal  Final   Culture NO GROWTH < 24 HOURS  Final   Report Status  PENDING  Incomplete    Medical History: Past Medical History  Diagnosis Date  . GERD (gastroesophageal reflux disease)     with past esophageal dilatation  . Hypertension   . Osteoporosis   . Anemia   . Depression   . TIA (transient ischemic attack)   . Urinary incontinence   . Pulmonary hypertension   . Metabolic encephalopathy   . Dementia     Medications:  Anti-infectives    Start     Dose/Rate Route Frequency Ordered Stop   04/15/15 1800  vancomycin (VANCOCIN) 500 mg in sodium chloride 0.9 % 100 mL IVPB     500 mg 100 mL/hr over 60 Minutes Intravenous Every 24 hours 04/15/15 1725     04/15/15 1530  piperacillin-tazobactam (ZOSYN) IVPB 3.375 g     3.375 g 12.5 mL/hr over 240 Minutes Intravenous 3 times per day 04/15/15 1528     04/14/15 1430  amoxicillin-clavulanate (AUGMENTIN) 400-57 MG/5ML suspension 872 mg  Status:  Discontinued     875 mg of amoxicillin Oral Every 12 hours 04/14/15 1421 04/15/15 1443   04/11/15 1800  vancomycin (VANCOCIN) 500 mg in sodium chloride 0.9 % 100 mL IVPB  Status:  Discontinued     500 mg 100 mL/hr over 60 Minutes Intravenous Every 24 hours 04/26/2015  2057 04/14/15 1421   04/28/2015 2200  piperacillin-tazobactam (ZOSYN) IVPB 3.375 g  Status:  Discontinued     3.375 g 12.5 mL/hr over 240 Minutes Intravenous 3 times per day 04/14/2015 2048 04/14/15 1421   04/23/2015 1830  vancomycin (VANCOCIN) IVPB 1000 mg/200 mL premix  Status:  Discontinued     1,000 mg 200 mL/hr over 60 Minutes Intravenous Every 24 hours 04/24/2015 1829 04/28/2015 2057   05/01/2015 1830  piperacillin-tazobactam (ZOSYN) IVPB 3.375 g  Status:  Discontinued     3.375 g 100 mL/hr over 30 Minutes Intravenous 4 times per day 04/03/2015 1829 04/28/2015 2048   04/15/2015 1815  piperacillin-tazobactam (ZOSYN) IVPB 3.375 g  Status:  Discontinued     3.375 g 12.5 mL/hr over 240 Minutes Intravenous  Once 04/16/2015 1804 04/03/2015 2048   04/03/2015 1815  vancomycin (VANCOCIN) IVPB 1000 mg/200 mL premix     Comments:  HCAP   1,000 mg 200 mL/hr over 60 Minutes Intravenous  Once 04/05/2015 1804 04/09/2015 1921     Assessment: Patient admitted for possible HCAP. Patient was started on Vanc and Zosyn. Patient had improved after several days and vanc/zosyn was d/c 7/14 and Augmentin started. Chest X-ray still shows pneumonia. Restarting on Vancomycin and Zosyn. Last vanc dose was given wed 7/13 @ 1759.   7/15 @ 15:39 - Trough level = 6 mcg/ml.   Goal of Therapy:  Vancomycin trough level 15-20 mcg/ml  Plan:  Restart Zosyn 3.375mg  q 8hr EI dosing.  Restart Vancomycin 500mg  IV Q24H.  Trough level ordered prior to 5th dose on 7/19 at 19:30.   Stormy CardKatsoudas,Zhi Geier K, [facility] HospitalRPH Clinical Pharmacist 04/15/2015 5:26 PM

## 2015-04-15 NOTE — Progress Notes (Signed)
Asked Dr. Elisabeth PigeonVachhani about keeping patient on IVF given effusions, respiratory status and CHF history. Received verbal order to discontinue IVF.

## 2015-04-15 NOTE — Progress Notes (Signed)
Notified Dr. Elisabeth PigeonVachhani of CXR results from this morning. Acknowledged, and pulmonary consult to be called.

## 2015-04-15 NOTE — Procedures (Signed)
Under US guidance, right thoracentesis was performed. Right effusion was very loculated. Only 15 mls of serous fluid obtained. CXR to follow.

## 2015-04-15 NOTE — Progress Notes (Signed)
[facility] and placed in chart.

## 2015-04-16 ENCOUNTER — Inpatient Hospital Stay: Payer: Medicare Other

## 2015-04-16 LAB — CBC
HCT: 38.4 % (ref 35.0–47.0)
Hemoglobin: 12.4 g/dL (ref 12.0–16.0)
MCH: 30.3 pg (ref 26.0–34.0)
MCHC: 32.3 g/dL (ref 32.0–36.0)
MCV: 93.7 fL (ref 80.0–100.0)
Platelets: 198 10*3/uL (ref 150–440)
RBC: 4.1 MIL/uL (ref 3.80–5.20)
RDW: 14.6 % — AB (ref 11.5–14.5)
WBC: 12.9 10*3/uL — AB (ref 3.6–11.0)

## 2015-04-16 LAB — BASIC METABOLIC PANEL
ANION GAP: 4 — AB (ref 5–15)
BUN: 16 mg/dL (ref 6–20)
CHLORIDE: 111 mmol/L (ref 101–111)
CO2: 26 mmol/L (ref 22–32)
Calcium: 8.2 mg/dL — ABNORMAL LOW (ref 8.9–10.3)
Creatinine, Ser: 0.84 mg/dL (ref 0.44–1.00)
GFR calc non Af Amer: 56 mL/min — ABNORMAL LOW (ref >60)
Glucose, Bld: 108 mg/dL — ABNORMAL HIGH (ref 65–99)
Potassium: 4.1 mmol/L (ref 3.5–5.1)
SODIUM: 141 mmol/L (ref 135–145)

## 2015-04-16 MED ORDER — ACETYLCYSTEINE 20 % IN SOLN
4.0000 mL | Freq: Two times a day (BID) | RESPIRATORY_TRACT | Status: DC
  Administered 2015-04-16 – 2015-04-18 (×5): 4 mL via RESPIRATORY_TRACT
  Filled 2015-04-16 (×8): qty 4

## 2015-04-16 MED ORDER — IOHEXOL 300 MG/ML  SOLN
75.0000 mL | Freq: Once | INTRAMUSCULAR | Status: AC | PRN
Start: 2015-04-16 — End: 2015-04-16
  Administered 2015-04-16: 75 mL via INTRAVENOUS

## 2015-04-16 NOTE — Progress Notes (Signed)
[facility] [place] [facility]   PATIENT NAME: Lindsay SheldonCarolyn Branch    MR#:  [id number]  DATE OF BIRTH:  30-Jan-1913  SUBJECTIVE:  CHIEF COMPLAINT:   Chief Complaint  Patient presents with  . Shortness of Breath  more drowsy today and hypoxic again.Needing venti mask today.  REVIEW OF SYSTEMS:  Pt is confused,  demented. ROS  DRUG ALLERGIES:   Allergies  Allergen Reactions  . Sucralfate Rash  . Tramadol     REACTION: hallucinations, paranoia  . Levofloxacin Rash    VITALS:  Blood pressure 167/75, pulse 76, temperature 97.6 F (36.4 C), temperature source Oral, resp. rate 20, height 5\' 3"  (1.6 m), weight 47.401 kg (104 lb 8 oz), SpO2 96 %.  PHYSICAL EXAMINATION:  GENERAL:  81 y.o.-year-old patient lying in the bed with no acute distress.  EYES: Pupils equal, round, reactive to light and accommodation. No scleral icterus. Extraocular muscles intact.  HEENT: Head atraumatic, normocephalic. Oropharynx and nasopharynx clear.  NECK:  Supple, no jugular venous distention. No thyroid enlargement, no tenderness.  LUNGS: decreased breath sounds bilaterally,mild crepitation. No use of accessory muscles of respiration. On increased oxygen venti mask, CARDIOVASCULAR: S1, S2 normal. No murmurs, rubs, or gallops.  ABDOMEN: Soft, nontender, nondistended. Bowel sounds present. No organomegaly or mass.  EXTREMITIES: No pedal edema, cyanosis, or clubbing.  NEUROLOGIC:  She is alert, follows simple commands. PSYCHIATRIC: alert. SKIN: No obvious rash, lesion, or ulcer.   Physical Exam LABORATORY PANEL:   CBC  Recent Labs Lab 04/16/15 0528  WBC 12.9*  HGB 12.4  HCT 38.4  PLT 198   ------------------------------------------------------------------------------------------------------------------  Chemistries   Recent Labs Lab April 03, 2015 1637  04/16/15 0528  NA 136  < > 141  K 3.9  < > 4.1  CL 100*  < > 111  CO2 29  < > 26  GLUCOSE 111*  < > 108*   BUN 19  < > 16  CREATININE 0.79  < > 0.84  CALCIUM 8.3*  < > 8.2*  MG 1.7  --   --   AST 18  --   --   ALT 10*  --   --   ALKPHOS 97  --   --   BILITOT 0.4  --   --   < > = values in this interval not displayed. ------------------------------------------------------------------------------------------------------------------  Cardiac Enzymes  Recent Labs Lab April 03, 2015 1637  TROPONINI <0.03   ------------------------------------------------------------------------------------------------------------------  RADIOLOGY:  Dg Chest 1 View  04/15/2015   CLINICAL DATA:  Status post right-sided thoracentesis.  EXAM: CHEST  1 VIEW  COMPARISON:  Same day.  FINDINGS: Stable cardiomegaly. Large hiatal hernia is noted. No pneumothorax is noted. Stable bibasilar opacities are noted concerning for pneumonia. Stable loculated right pleural effusion is seen along the right lateral chest wall. Bony thorax is intact.  IMPRESSION: No pneumothorax status post right-sided thoracentesis. Stable loculated pleural effusion seen along right lateral chest wall. Stable bibasilar opacities compared to prior exam. Moderate hiatal hernia is noted.   Electronically Signed   By: Lupita RaiderJames  Green Jr, M.D.   On: 04/15/2015 17:01   Dg Chest 2 View  04/15/2015   CLINICAL DATA:  81 year old female with shortness of breath. Pneumonia.  EXAM: CHEST  2 VIEW  COMPARISON:  Chest x-ray 04/12/2015.  FINDINGS: Again noted is diffuse peribronchial cuffing, and interstitial prominence, and patchy airspace opacities throughout the lungs bilaterally. The airspace disease is most confluent in the lung bases bilaterally and in the right  mid lung. Increasing moderate loculated right pleural effusion. Small left pleural effusion. New area of band-like atelectasis in the left mid lung. Fluid tracking in the minor fissure. Mild cephalization of the pulmonary vasculature, without frank pulmonary edema. Heart size is mildly enlarged, but similar to  prior studies, and likely accentuated by patient's rotation to the left which also distorts upper mediastinal contours. Atherosclerosis in the thoracic aorta.  IMPRESSION: 1. Findings remain most concerning for multilobar pneumonia, with increasing moderate partially loculated right-sided pleural effusion. 2. Small left pleural effusion. 3. Mild cardiomegaly. 4. Atherosclerosis.   Electronically Signed   By: Trudie Reed M.D.   On: 04/15/2015 07:55   US Thoracentesis Asp Pleural Space W/img Guide  04/15/2015   CLINICAL DATA:  Right pleural effusion.  EXAM: ULTRASOUND GUIDED right THORACENTESIS  COMPARISON:  Chest radiograph of same date.  PROCEDURE: Informed consent was obtained from the patient's power of attorney.  Ultrasound was performed to localize and mark an adequate pocket of fluid in the right chest. The area was then prepped and draped in the normal sterile fashion. 1% Lidocaine was used for local anesthesia. Under ultrasound guidance a Safe-T-Centesis catheter was introduced. Thoracentesis was performed. The catheter was removed and a dressing applied.  Complications:  None immediate.  FINDINGS: A total of approximately 15 mL of serous fluid was removed. A fluid sample wassent for laboratory analysis. Only a small amount of fluid was obtained due to the extremely loculated nature of this effusion.  IMPRESSION: Successful ultrasound guided right thoracentesis yielding 15 mL of pleural fluid.   Electronically Signed   By: Lupita Raider, M.D.   On: 04/15/2015 16:54    ASSESSMENT AND PLAN:   1' acute hypoxic respiratory failure secondary to bilateral pneumonia.   Also have pleural effusion now.  Patient of likely has a healthy Associated pneumonia  Continue vancomycin and Zosyn,    Follow blood cultures- negative so far, continue oxygen.   appreciated swallow eval.   She had worsening of condition - Xray looks worse- needed more oxygen.    Change Abx to Augmentin liquid, as she might have  good and bad days on-off. Changed back to vanc+ zosyn.   Pulm consult-  He suggested to get Pleural tap. Pt agreed for that.     I tried to convince HCPOA to convert pt to hospice care/ DNR- but she is a full code.    So we will continue therapy till she improves. Not ready for discharge due to lethargy, hypoxia.  May need to get transfer to CCU / intubation.  #2:  speech therapy evaluation due to her dementia and pneumonia. #3: GERD continue PPIs # 4: History of depression continue mirtazapine. # 5: History of glaucoma continue eye drops. #6. Recent UTI- as per care taker she had UTI last week- UA positive, onZosyn.  All the records are reviewed and case discussed with Care Management/Social Workerr. Management plans discussed with the patient, family and they are in agreement.  CODE STATUS: full TOTAL TIME TAKING CARE OF THIS PATIENT: Critical care 45 minutes.   More than 50% of the visit was spent in counseling/coordination of care  POSSIBLE D/C IN 2-3 DAYS, DEPENDING ON CLINICAL CONDITION.   Altamese Dilling M.D on 04/16/2015   Between 7am to 6pm - Pager - [phone]  After 6pm go to [email or web address] - password [email or web address] [facility]  [facility]  Office  [phone]  CC: Primary care physician; Tillman Abide, MD

## 2015-04-16 NOTE — Progress Notes (Signed)
[facility] [place] [facility]   PATIENT NAME: Lindsay SheldonCarolyn Senft    MR#:  [id number]  DATE OF BIRTH:  1913-03-24  SUBJECTIVE:  CHIEF COMPLAINT:   Chief Complaint  Patient presents with  . Shortness of Breath   Patient the still on high flow oxygen unable to give me any review of systems  REVIEW OF SYSTEMS:  Pt is confused,  demented. ROS unobtainable  DRUG ALLERGIES:   Allergies  Allergen Reactions  . Sucralfate Rash  . Tramadol     REACTION: hallucinations, paranoia  . Levofloxacin Rash    VITALS:  Blood pressure 167/75, pulse 76, temperature 97.6 F (36.4 C), temperature source Oral, resp. rate 20, height 5\' 3"  (1.6 m), weight 47.401 kg (104 lb 8 oz), SpO2 92 %.  PHYSICAL EXAMINATION:  GENERAL:  81 y.o.-year-old patient lying in the bed critically ill-appearing EYES: Pupils equal, round, reactive to light and accommodation. No scleral icterus. Extraocular muscles intact.  HEENT: Head atraumatic, normocephalic. Oropharynx and nasopharynx clear.  NECK:  Supple, no jugular venous distention. No thyroid enlargement, no tenderness.  LUNGS: Rhonchi in the both lungs with a sensory muscle usage  CARDIOVASCULAR: S1, S2 normal. No murmurs, rubs, or gallops.  ABDOMEN: Soft, nontender, nondistended. Bowel sounds present. No organomegaly or mass.  EXTREMITIES: No pedal edema, cyanosis, or clubbing.  NEUROLOGIC:  She is alert, follows simple commands. PSYCHIATRIC: alert. SKIN: No obvious rash, lesion, or ulcer.   Physical Exam LABORATORY PANEL:   CBC  Recent Labs Lab 04/16/15 0528  WBC 12.9*  HGB 12.4  HCT 38.4  PLT 198   ------------------------------------------------------------------------------------------------------------------  Chemistries   Recent Labs Lab 04/09/2015 1637  04/16/15 0528  NA 136  < > 141  K 3.9  < > 4.1  CL 100*  < > 111  CO2 29  < > 26  GLUCOSE 111*  < > 108*  BUN 19  < > 16  CREATININE 0.79  < > 0.84   CALCIUM 8.3*  < > 8.2*  MG 1.7  --   --   AST 18  --   --   ALT 10*  --   --   ALKPHOS 97  --   --   BILITOT 0.4  --   --   < > = values in this interval not displayed. ------------------------------------------------------------------------------------------------------------------  Cardiac Enzymes  Recent Labs Lab 04/14/2015 1637  TROPONINI <0.03   ------------------------------------------------------------------------------------------------------------------  RADIOLOGY:  Dg Chest 1 View  04/15/2015   CLINICAL DATA:  Status post right-sided thoracentesis.  EXAM: CHEST  1 VIEW  COMPARISON:  Same day.  FINDINGS: Stable cardiomegaly. Large hiatal hernia is noted. No pneumothorax is noted. Stable bibasilar opacities are noted concerning for pneumonia. Stable loculated right pleural effusion is seen along the right lateral chest wall. Bony thorax is intact.  IMPRESSION: No pneumothorax status post right-sided thoracentesis. Stable loculated pleural effusion seen along right lateral chest wall. Stable bibasilar opacities compared to prior exam. Moderate hiatal hernia is noted.   Electronically Signed   By: Lupita RaiderJames  Green Jr, M.D.   On: 04/15/2015 17:01   Dg Chest 2 View  04/15/2015   CLINICAL DATA:  81 year old female with shortness of breath. Pneumonia.  EXAM: CHEST  2 VIEW  COMPARISON:  Chest x-ray 04/12/2015.  FINDINGS: Again noted is diffuse peribronchial cuffing, and interstitial prominence, and patchy airspace opacities throughout the lungs bilaterally. The airspace disease is most confluent in the lung bases bilaterally and in the right mid  lung. Increasing moderate loculated right pleural effusion. Small left pleural effusion. New area of band-like atelectasis in the left mid lung. Fluid tracking in the minor fissure. Mild cephalization of the pulmonary vasculature, without frank pulmonary edema. Heart size is mildly enlarged, but similar to prior studies, and likely accentuated by  patient's rotation to the left which also distorts upper mediastinal contours. Atherosclerosis in the thoracic aorta.  IMPRESSION: 1. Findings remain most concerning for multilobar pneumonia, with increasing moderate partially loculated right-sided pleural effusion. 2. Small left pleural effusion. 3. Mild cardiomegaly. 4. Atherosclerosis.   Electronically Signed   By: Trudie Reed M.D.   On: 04/15/2015 07:55   US Thoracentesis Asp Pleural Space W/img Guide  04/15/2015   CLINICAL DATA:  Right pleural effusion.  EXAM: ULTRASOUND GUIDED right THORACENTESIS  COMPARISON:  Chest radiograph of same date.  PROCEDURE: Informed consent was obtained from the patient's power of attorney.  Ultrasound was performed to localize and mark an adequate pocket of fluid in the right chest. The area was then prepped and draped in the normal sterile fashion. 1% Lidocaine was used for local anesthesia. Under ultrasound guidance a Safe-T-Centesis catheter was introduced. Thoracentesis was performed. The catheter was removed and a dressing applied.  Complications:  None immediate.  FINDINGS: A total of approximately 15 mL of serous fluid was removed. A fluid sample wassent for laboratory analysis. Only a small amount of fluid was obtained due to the extremely loculated nature of this effusion.  IMPRESSION: Successful ultrasound guided right thoracentesis yielding 15 mL of pleural fluid.   Electronically Signed   By: Lupita Raider, M.D.   On: 04/15/2015 16:54    ASSESSMENT AND PLAN:   #1:acute hypoxic respiratory failure secondary to bilateral pneumonia.   Noted to have pleural effusion status post drainage with minimal fluid. Apparently fluid is loculated  Patient of likely has a healthy Associated pneumonia  Continue vancomycin and Zosyn,   She was seen by pulmonary who raised the question of possible chest tube - at her age this procedure would not be beneficial to her  Previous M.D. has spoken to [facility] to convert pt to  hospice care/ DNR-however they have been resistant to changing her CODE STATUS    #2:  speech therapy evaluation due to her dementia and pneumonia. #3: GERD continue PPIs # 4: History of depression continue mirtazapine. # 5: History of glaucoma continue eye drops. #6. Recent UTI- as per care taker she had UTI last week- UA positive, onZosyn.  Prognosis: Very poor in this 81 year old female who has progressively gotten worst since her admission. Continue aggressive treatment as per healthcare power of attorney but appears to be a futile effort.   All the records are reviewed and case discussed with Care Management/Social Workerr. Management plans discussed with the patient, family and they are in agreement.  CODE STATUS: full TOTAL TIME TAKING CARE OF THIS PATIENT: Critical care 45 minutes.      Auburn Bilberry M.D on 04/16/2015   Between 7am to 6pm - Pager - [phone]  After 6pm go to [email or web address] - password [email or web address] [facility]  [facility]  Office  [phone]  CC: Primary care physician; Tillman Abide, MD

## 2015-04-16 NOTE — Plan of Care (Signed)
Problem: ICU Phase Progression Outcomes Goal: O2 sats trending toward baseline Outcome: Not Progressing Requiring NRB for O2 delivery

## 2015-04-16 NOTE — Plan of Care (Signed)
Problem: Consults Goal: Skin Care Protocol Initiated - if Braden Score 18 or less If consults are not indicated, leave blank or document N/A  Outcome: Not Progressing Sacrum purple, blanchable. Foam applied

## 2015-04-17 LAB — CREATININE, SERUM
Creatinine, Ser: 0.92 mg/dL (ref 0.44–1.00)
GFR calc Af Amer: 58 mL/min — ABNORMAL LOW (ref >60)
GFR calc non Af Amer: 50 mL/min — ABNORMAL LOW (ref >60)

## 2015-04-17 MED ORDER — OXYCODONE HCL 5 MG PO TABS
5.0000 mg | ORAL_TABLET | ORAL | Status: DC | PRN
  Administered 2015-04-17: 5 mg via ORAL
  Filled 2015-04-17 (×2): qty 1

## 2015-04-17 NOTE — Progress Notes (Signed)
Dr. Clent RidgesWalsh called back updated on pts status, orders received.

## 2015-04-17 NOTE — Progress Notes (Signed)
[facility] [place] [facility]   PATIENT NAME: Lindsay Branch    MR#:  [id number]  DATE OF BIRTH:  1913/05/16  SUBJECTIVE:  CHIEF COMPLAINT:   Chief Complaint  Patient presents with  . Shortness of Breath   Back on Venturi mask still having respiratory difficulties  REVIEW OF SYSTEMS:  Pt is confused,  demented. ROS unobtainable  DRUG ALLERGIES:   Allergies  Allergen Reactions  . Sucralfate Rash  . Tramadol     REACTION: hallucinations, paranoia  . Levofloxacin Rash    VITALS:  Blood pressure 110/62, pulse 87, temperature 98.4 F (36.9 C), temperature source Oral, resp. rate 22, height 5\' 3"  (1.6 m), weight 47.401 kg (104 lb 8 oz), SpO2 93 %.  PHYSICAL EXAMINATION:  GENERAL:  81 y.o.-year-old patient lying in the bed critically ill-appearing EYES: Pupils equal, round, reactive to light and accommodation. No scleral icterus. Extraocular muscles intact.  HEENT: Head atraumatic, normocephalic. Oropharynx and nasopharynx clear.  NECK:  Supple, no jugular venous distention. No thyroid enlargement, no tenderness.  LUNGS: Rhonchi in the both lungs with a accesory muscle usage  CARDIOVASCULAR: S1, S2 normal. No murmurs, rubs, or gallops.  ABDOMEN: Soft, nontender, nondistended. Bowel sounds present. No organomegaly or mass.  EXTREMITIES: No pedal edema, cyanosis, or clubbing.  NEUROLOGIC:  She is alert, follows simple commands. PSYCHIATRIC: alert. SKIN: No obvious rash, lesion, or ulcer.   Physical Exam LABORATORY PANEL:   CBC  Recent Labs Lab 04/16/15 0528  WBC 12.9*  HGB 12.4  HCT 38.4  PLT 198   ------------------------------------------------------------------------------------------------------------------  Chemistries   Recent Labs Lab 04/12/2015 1637  04/16/15 0528 04/17/15 0511  NA 136  < > 141  --   K 3.9  < > 4.1  --   CL 100*  < > 111  --   CO2 29  < > 26  --   GLUCOSE 111*  < > 108*  --   BUN 19  < > 16  --    CREATININE 0.79  < > 0.84 0.92  CALCIUM 8.3*  < > 8.2*  --   MG 1.7  --   --   --   AST 18  --   --   --   ALT 10*  --   --   --   ALKPHOS 97  --   --   --   BILITOT 0.4  --   --   --   < > = values in this interval not displayed. ------------------------------------------------------------------------------------------------------------------  Cardiac Enzymes  Recent Labs Lab 04/13/2015 1637  TROPONINI <0.03   ------------------------------------------------------------------------------------------------------------------  RADIOLOGY:  Dg Chest 1 View  04/15/2015   CLINICAL DATA:  Status post right-sided thoracentesis.  EXAM: CHEST  1 VIEW  COMPARISON:  Same day.  FINDINGS: Stable cardiomegaly. Large hiatal hernia is noted. No pneumothorax is noted. Stable bibasilar opacities are noted concerning for pneumonia. Stable loculated right pleural effusion is seen along the right lateral chest wall. Bony thorax is intact.  IMPRESSION: No pneumothorax status post right-sided thoracentesis. Stable loculated pleural effusion seen along right lateral chest wall. Stable bibasilar opacities compared to prior exam. Moderate hiatal hernia is noted.   Electronically Signed   By: Lupita RaiderJames  Green Jr, M.D.   On: 04/15/2015 17:01   Ct Chest W Contrast  04/16/2015   CLINICAL DATA:  81 year old female with a history of congestive heart failure, pulmonary hypertension, dementia and a history of pneumonia.  EXAM: CT CHEST WITH  CONTRAST  TECHNIQUE: Multidetector CT imaging of the chest was performed during intravenous contrast administration.  CONTRAST:  75mL OMNIPAQUE IOHEXOL 300 MG/ML  SOLN  COMPARISON:  No prior chest CT. Contemporaneously chest x-ray 04/15/2015  FINDINGS: Chest:  Superficial soft tissues of the chest wall demonstrate mild inflammatory changes and edema.  No axillary or supraclavicular adenopathy.  Nodular changes of the right thyroid, nonspecific on CT.  Large hiatal hernia with essentially intra  thoracic stomach. Density along the mesenteric surface of the herniated stomach most likely related to a prior surgery.  Fluid filled stomach, with no evidence of obstruction.  Multiple mediastinal lymph nodes on the left than the right. Largest are within the lowest peritracheal nodal station and the prevascular nodal stations. Largest on the left measures 12 mm in short axis dimension, largest on the right measures 12 mm in short axis dimension.  Bilateral hilar lymph nodes.  Heart size enlarged. Dense calcifications of the mitral annulus. Calcifications of the left main, left anterior descending, circumflex, right coronary arteries. No pericardial fluid/ thickening.  Trachea is patent.  Left and right mainstem bronchus patent.  Debris within the proximal bronchi of the right lower lobe.  Loculated fluid of the right pleural space with associated atelectasis of the lung. No significant enhancement is appreciated at the pleural surface. Hyperdense material at the lateral aspect of the right pleural space, potentially pleural plaques or postoperative changes.  Trace pleural fluid on the left in the costophrenic sulcus.  Mixed ground-glass and nodular opacities the bilateral lungs. No large region of confluent airspace disease.  Caliber and contour of the thoracic aorta unremarkable tortuosity is present. Mixed atheromatous and calcified plaque of the visualized aorta. No dissection flap. No aneurysm. No periaortic fluid. Branch vessels are patent with atherosclerotic changes at the origin. Three vessel arch.  No central lobar or proximal segmental filling defects. Beyond this, the study is limited for evaluation of distal pulmonary emboli.  Unremarkable appearance of the upper abdomen.  Compression fracture of T12 with mild retropulsion of the posterior superior endplate. At this level the canal measures approximately 10 mm in diameter on the reformatted sagittal images.  Degenerative disc disease of the thoracic  spine. No additional displaced fractures. Posttraumatic deformity of left-sided rib angles with no displaced rib fractures identified.  IMPRESSION: Bilateral loculated pleural fluid, moderate on the right and small on the left. Sterility is indeterminate by imaging, however, the loculated appearance suggests a complex effusion, potentially empyema. Recommend correlation with recent thoracentesis results.  Multiple mediastinal lymph nodes, likely reactive given the appearance of the pleural fluid.  Large hiatal hernia with essentially intra thoracic stomach. Evidence of prior surgical repair.  Atherosclerosis with left main and 3 vessel coronary artery disease.  Age indeterminate T12 compression fracture with minimal displacement of the superior posterior endplate. No significant canal narrowing.  No evidence of pulmonary emboli, though the study is somewhat limited.  Signed,  Yvone Neu. Loreta Ave, DO  [facility]  [facility]   Electronically Signed   By: Gilmer Mor D.O.   On: 04/16/2015 14:56   US Thoracentesis Asp Pleural Space W/img Guide  04/15/2015   CLINICAL DATA:  Right pleural effusion.  EXAM: ULTRASOUND GUIDED right THORACENTESIS  COMPARISON:  Chest radiograph of same date.  PROCEDURE: Informed consent was obtained from the patient's power of attorney.  Ultrasound was performed to localize and mark an adequate pocket of fluid in the right chest. The area was then prepped and draped  in the normal sterile fashion. 1% Lidocaine was used for local anesthesia. Under ultrasound guidance a Safe-T-Centesis catheter was introduced. Thoracentesis was performed. The catheter was removed and a dressing applied.  Complications:  None immediate.  FINDINGS: A total of approximately 15 mL of serous fluid was removed. A fluid sample wassent for laboratory analysis. Only a small amount of fluid was obtained due to the extremely loculated nature of this effusion.  IMPRESSION:  Successful ultrasound guided right thoracentesis yielding 15 mL of pleural fluid.   Electronically Signed   By: Lupita Raider, M.D.   On: 04/15/2015 16:54    ASSESSMENT AND PLAN:   #1:acute hypoxic respiratory failure secondary to bilateral pneumonia.   Noted to have pleural effusion status post drainage with minimal fluid. Apparently fluid is loculated  Patient of likely has a healthy Associated pneumonia  Continue vancomycin and Zosyn,   She was seen by pulmonary who raised the question of possible chest tube - at her age this procedure would not be beneficial to her   She had a CT scan of the chest with loculated pleural effusion, this would be very hard to treat I have updated healthcare power of attorney regarding this finding and explained to them that patient is in no condition to have any type of surgical intervention for this. It's unlikely she would survive the surgery as well as recovery from the procedure #2:  speech therapy evaluation due to her dementia and pneumonia. #3: GERD continu ppi # 4: History of depression continue mirtazapine. # 5: History of glaucoma continue eye drops. #6. Recent UTI- as per care taker she had UTI last week- UA positive, onZosyn.  Prognosis: Her healthcare power of attorney attorney is deciding on him her CODE STATUS and comfort care   All the records are reviewed and case discussed with Care Management/Social Workerr. Management plans discussed with the patient, family and they are in agreement.  CODE STATUS: full TOTAL TIME TAKING CARE OF THIS PATIENT: Critical care .      Auburn Bilberry M.D on 04/17/2015   Between 7am to 6pm - Pager - [phone]  After 6pm go to [email or web address] - password [email or web address] [facility]  [facility]  Office  [phone]  CC: Primary care physician; Tillman Abide, MD

## 2015-04-17 NOTE — Progress Notes (Signed)
Pt. Complaining of pain of 8/10, Pt had tylenol suppository earlier on day shift. Contacted Md, Hower. Order received for Oxycodone  PO every 4 hours PRN.

## 2015-04-17 NOTE — Progress Notes (Signed)
Assessment completed.  Pt on NRB mask at this time without distress. Cardiac monitor in place.  Lungs with scattered exp wheezes, diminished bil.  Dressing intact to sacral area.  Pt alert to person and place, denies need.  CB in reach, SR up x 2, bed alarm on.

## 2015-04-17 NOTE — Progress Notes (Signed)
Lindsay Branch, POA voiced her wishes to myself and Leslie Dalesoll, RN to make pt a DNR.  Page out to MD.

## 2015-04-18 LAB — BODY FLUID CULTURE
CULTURE: NO GROWTH
GRAM STAIN: NONE SEEN

## 2015-04-18 LAB — URINE CULTURE
Culture: >=100000
SPECIAL REQUESTS: NORMAL

## 2015-04-18 NOTE — Care Management (Signed)
Patient is now a DNR but HCPOA was not ready to make patient comfort care when made the decision for DNR.  Discussed during progression of need to request order for Roxanol as patient is having some difficulty swallowing pills. She remains on venti mask/ non rebreather.  May require bipap.  Will discuss again approaching comfort care with  with attending .  It is reported the family still has hope patient is going to recover.

## 2015-04-18 NOTE — Progress Notes (Signed)
[facility] [place] at [facility]   PATIENT NAME: Lindsay Branch    MR#:  [id number]  DATE OF BIRTH:  1913/09/27  SUBJECTIVE:  CHIEF COMPLAINT:   Chief Complaint  Patient presents with  . Shortness of Breath   Still on Venturi mask not responding was uncomfortable last night given some pain medications healthcare power of attorney has finally made the decision to make patient DO NOT RESUSCITATE  REVIEW OF SYSTEMS:  Pt is confused,  demented. ROS unobtainable  DRUG ALLERGIES:   Allergies  Allergen Reactions  . Sucralfate Rash  . Tramadol     REACTION: hallucinations, paranoia  . Levofloxacin Rash    VITALS:  Blood pressure 104/44, pulse 90, temperature 97.4 F (36.3 C), temperature source Oral, resp. rate 32, height  (1.6 m), weight 52.844 kg (116 lb 8 oz), SpO2 91 %.  PHYSICAL EXAMINATION:  GENERAL:  81 y.o.-year-old patient lying in the bed critically ill-appearing EYES: Pupils equal, round, reactive to light and accommodation. No scleral icterus. Extraocular muscles intact.  HEENT: Head atraumatic, normocephalic. Oropharynx and nasopharynx clear.  NECK:  Supple, no jugular venous distention. No thyroid enlargement, no tenderness.  LUNGS: Rhonchi in the both lungs with a accesory muscle usage  CARDIOVASCULAR: S1, S2 normal. No murmurs, rubs, or gallops.  ABDOMEN: Soft, nontender, nondistended. Bowel sounds present. No organomegaly or mass.  EXTREMITIES: No pedal edema, cyanosis, or clubbing.  NEUROLOGIC:  She is alert, follows simple commands. PSYCHIATRIC: alert. SKIN: No obvious rash, lesion, or ulcer.   Physical Exam LABORATORY PANEL:   CBC  Recent Labs Lab 04/16/15 0528  WBC 12.9*  HGB 12.4  HCT 38.4  PLT 198   ------------------------------------------------------------------------------------------------------------------  Chemistries   Recent Labs Lab 04/16/15 0528 04/17/15 0511  NA 141  --   K 4.1  --   CL  111  --   CO2 26  --   GLUCOSE 108*  --   BUN 16  --   CREATININE 0.84 0.92  CALCIUM 8.2*  --    ------------------------------------------------------------------------------------------------------------------  Cardiac Enzymes No results for input(s): TROPONINI in the last 168 hours. ------------------------------------------------------------------------------------------------------------------  RADIOLOGY:  No results found.  ASSESSMENT AND PLAN:   #1:acute hypoxic respiratory failure secondary to bilateral pneumonia.   Noted to have pleural effusion status post drainage with minimal fluid. Apparently fluid is loculated  Patient of likely has a healthy Associated pneumonia  Continue vancomycin and Zosyn,   She was seen by pulmonary who raised the question of possible chest tube - at her age this procedure would not be beneficial to her   She had a CT scan of the chest with loculated pleural effusion, this would be very hard to treat  #2:  speech therapy evaluation due to her dementia and pneumonia. #3: GERD continu ppi # 4: History of depression continue mirtazapine. # 5: History of glaucoma continue eye drops. #6. Recent UTI- as per care taker she had UTI last week- UA positive, onZosyn.\   Patient's healthcare power of attorney to make decision regarding comfort care she has not shown any improvement despite aggressive therapy at her advanced age  Prognosis: Her healthcare power of attorney attorney is deciding on him her CODE STATUS and comfort care   All the records are reviewed and case discussed with Care Management/Social Workerr. Management plans discussed with the patient, family and they are in agreement.  CODE STATUS:DO NOT RESUSCITATE  TOTAL TIME TAKING CARE OF THIS PATIENT: Critical care .  Auburn BilberryPATEL, Perris Tripathi M.D on 04/18/2015   Between 7am to 6pm - Pager - [phone]  After 6pm go to [email or web address] - password [email or web address] [facility]  [facility]  Office  [phone]  CC: Primary care physician; Tillman Abideichard Letvak, MD

## 2015-04-18 NOTE — Care Management Important Message (Signed)
Important Message  Patient Details  Name: Lindsay Branch MRN: [id number] Date of Birth: July 21, 1913   Medicare Important Message Given:  Yes-second notification given    Lindsay Branch 04/18/2015, 1:34 PM

## 2015-04-18 NOTE — Progress Notes (Signed)
Nutrition Follow-up    INTERVENTION:   Medical Food Supplement Therapy: continue Ensure supplements, add Magic cup BID Meals/Snacks: cater to pt preferences; per family request, will send "softer" foods on meal trays like yogurt. Family inquiring about "softer" diet, discussed option of downgrading diet even further to Dysphagia I or II if they request. Encouraged family to not feed pt unless pt fully alert.    NUTRITION DIAGNOSIS:   Inadequate oral intake related to chronic illness as evidenced by mild depletion of muscle mass, moderate depletions of muscle mass, per patient/family report. Continues   GOAL:   Patient will meet greater than or equal to 90% of their needs    MONITOR:    (Energy Intake, Anthropometrics, Electrolyte/Renal Profile, Digestive System)  ASSESSMENT:   Pt admitted with acute respiratory failure with bilateral pneumonia; pt on non-rebreather, on visit today pt not responding to voice; now DNR, family to make further decisions regarding poc.  Diet Order:  DIET DYS 3 Room service appropriate?: Yes; Fluid consistency:: Thin   Energy Intake: recorded po intake 0% to sips/bites; family at bedside did not attempt to feed pt at lunch today due to decreased mental status, family reports pt did not eat breakfast yesterday but able to get pt to eat yogurt at lunch.   Skin:  Reviewed, no issues  Last BM:  7/18   Electrolyte and Renal Profile:  Recent Labs Lab 04/14/15 0441 04/16/15 0528 04/17/15 0511  BUN 12 16  --   CREATININE 0.79 0.84 0.92  NA 143 141  --   K 4.2 4.1  --    Nutritional Anemia Profile:  CBC Latest Ref Rng 04/16/2015 04/14/2015 04/11/2015  WBC 3.6 - 11.0 K/uL 12.9(H) 8.3 10.5  Hemoglobin 12.0 - 16.0 g/dL 16.112.4 11.7(L) 12.9  Hematocrit 35.0 - 47.0 % 38.4 36.3 40.7  Platelets 150 - 440 K/uL 198 183 178     Height:   Ht Readings from Last 1 Encounters:  2015/02/02 5\' 3"  (1.6 m)    Weight:   Wt Readings from Last 1 Encounters:   04/18/15 116 lb 8 oz (52.844 kg)    Ideal Body Weight:     Wt Readings from Last 10 Encounters:  04/18/15 116 lb 8 oz (52.844 kg)  04/17/12 98 lb (44.453 kg)  01/17/12 95 lb (43.092 kg)  10/25/11 95 lb (43.092 kg)  09/27/11 100 lb (45.36 kg)  06/21/11 99 lb (44.906 kg)  04/05/11 100 lb (45.36 kg)  01/04/11 102 lb 8 oz (46.494 kg)  09/14/10 110 lb (49.896 kg)  06/22/10 109 lb (49.442 kg)    BMI:  Body mass index is 20.64 kg/(m^2).  Estimated Nutritional Needs:   Kcal:  [id number] kcals (BEE 818, 1.3 AF, 1.0-1.2 IF)   Protein:  47-56  g (1.0-1.2 g/kg)   Fluid:  1175-1410 mL (25-30 ml/kg)   EDUCATION NEEDS:   Education needs no appropriate at this time  HIGH Care Level  Romelle Starcherate Wren Pryce MS, RD, LDN [phone] Pager

## 2015-04-18 NOTE — Progress Notes (Signed)
Assessment completed.  IV antibiotics infusing well lt fa.  No distress on NRB mask.  Pt arousable, oriented to person and place.  Pt cleaned up of medium soft BM, peri area cleaned and barrier cream applied.  Lungs diminished bil.  Pt denies pain at this time.  CB in reach, SR up x 2, bed alarm on.

## 2015-04-19 MED ORDER — SODIUM CHLORIDE 0.9 % IV BOLUS (SEPSIS)
1000.0000 mL | Freq: Once | INTRAVENOUS | Status: AC
  Administered 2015-04-19: 1000 mL via INTRAVENOUS

## 2015-04-19 MED ORDER — MORPHINE SULFATE 2 MG/ML IJ SOLN
1.0000 mg | Freq: Once | INTRAMUSCULAR | Status: AC
  Administered 2015-04-19: 1 mg via INTRAVENOUS
  Filled 2015-04-19: qty 1

## 2015-04-22 LAB — PATHOLOGIST SMEAR REVIEW

## 2015-05-02 NOTE — Progress Notes (Signed)
Called to bedside to assess vital signs.  No heart sounds, pulse, or respirations.  Asystole on telemetry.  Time of death [id number].

## 2015-05-02 NOTE — Progress Notes (Signed)
Pt's respirations are 28 per minute, BP 94/50. MD, Sheryle Hailiamond notified. Orders received for morphine 1mg  IV push and a 1 Liter NS Bolus.

## 2015-05-02 NOTE — Progress Notes (Signed)
Pt's HR dropping on monitor, MD. Sheryle Hailiamond and supervisor notified.

## 2015-05-02 NOTE — Discharge Summary (Signed)
Lindsay Branch, 81 y.o., DOB 10-Apr-1913, MRN [id number]. Admission date: 05/01/2015 Discharge Date 04-Nov-2014 Primary MD Tillman Abideichard Letvak, MD Admitting Physician Katha HammingSnehalatha Konidena, MD  Admission Diagnosis  Acute respiratory failure with hypoxia and hypercarbia [J96.01]  Discharge Diagnosis   Active Problems:   Acute respiratory failure with hypoxia  loculated pleural effusion with possible empyema Possible aspiration pneumonia GERD Depression UTI        Hospital Course Lindsay Branch is a 81 y.o. female with a known history of CHF, pulmonary hypertension, dementia brought in from [facility] [place] secondary to respiratory distressed and tachypnea. Patient to O2 saturations were 70% at [facility]. Patient placed on 4 L of nasal cannula and the EMS was called and when EMS arrived patient has tachypnea with respiratory rate of 40 and hypoxemia. Patient placed on nonrebreather patient was noted to have pneumonia. Continue to not improve was on high flow oxygen BiPAP despite aggressive anabiotic she showed no improvement. Multiple conversations were held with the healthcare power of attorney and they did not want to make her DO NOT RESUSCITATE. Finally a CT scan of the chest was obtained which showed thoracentesis was attempted but not much fluid was drawn. After the CT scan results I discussed the situation with the healthcare power of attorney patient was made DO NOT RESUSCITATE. Overnight patient continued to progressively get worse and the patient passed away she was pronounced dead by Dr. Sheryle Haildiamond.            Consults  pulmonary/intensive care  Significant Tests:  See full reports for all details    Dg Chest 1 View  04/15/2015   CLINICAL DATA:  Status post right-sided thoracentesis.  EXAM: CHEST  1 VIEW  COMPARISON:  Same day.  FINDINGS: Stable cardiomegaly. Large hiatal hernia is noted. No pneumothorax is noted. Stable bibasilar opacities are noted concerning for pneumonia.  Stable loculated right pleural effusion is seen along the right lateral chest wall. Bony thorax is intact.  IMPRESSION: No pneumothorax status post right-sided thoracentesis. Stable loculated pleural effusion seen along right lateral chest wall. Stable bibasilar opacities compared to prior exam. Moderate hiatal hernia is noted.   Electronically Signed   By: Lupita RaiderJames  Green Jr, M.D.   On: 04/15/2015 17:01   Dg Chest 2 View  04/15/2015   CLINICAL DATA:  81 year old female with shortness of breath. Pneumonia.  EXAM: CHEST  2 VIEW  COMPARISON:  Chest x-ray 04/12/2015.  FINDINGS: Again noted is diffuse peribronchial cuffing, and interstitial prominence, and patchy airspace opacities throughout the lungs bilaterally. The airspace disease is most confluent in the lung bases bilaterally and in the right mid lung. Increasing moderate loculated right pleural effusion. Small left pleural effusion. New area of band-like atelectasis in the left mid lung. Fluid tracking in the minor fissure. Mild cephalization of the pulmonary vasculature, without frank pulmonary edema. Heart size is mildly enlarged, but similar to prior studies, and likely accentuated by patient's rotation to the left which also distorts upper mediastinal contours. Atherosclerosis in the thoracic aorta.  IMPRESSION: 1. Findings remain most concerning for multilobar pneumonia, with increasing moderate partially loculated right-sided pleural effusion. 2. Small left pleural effusion. 3. Mild cardiomegaly. 4. Atherosclerosis.   Electronically Signed   By: Trudie Reedaniel  Entrikin M.D.   On: 04/15/2015 07:55   Dg Chest 2 View  04/12/2015   CLINICAL DATA:  Pneumonia.  History of hypertension.  EXAM: CHEST  2 VIEW  COMPARISON:  04/12/2015  FINDINGS: Heart is enlarged. There are increasing infiltrates bilaterally,  right greater than left. Confluent opacity developing at the right lung base. There are bilateral pleural effusions, increased on the right. Confluent opacity  again noted at the left lung base which obscures the left hemidiaphragm.  IMPRESSION: Increasing bilateral pulmonary infiltrates consistent with edema and/or infectious process.   Electronically Signed   By: Norva Pavlov M.D.   On: 04/12/2015 10:53   Ct Chest W Contrast  04/16/2015   CLINICAL DATA:  81 year old female with a history of congestive heart failure, pulmonary hypertension, dementia and a history of pneumonia.  EXAM: CT CHEST WITH CONTRAST  TECHNIQUE: Multidetector CT imaging of the chest was performed during intravenous contrast administration.  CONTRAST:  75mL OMNIPAQUE IOHEXOL 300 MG/ML  SOLN  COMPARISON:  No prior chest CT. Contemporaneously chest x-ray 04/15/2015  FINDINGS: Chest:  Superficial soft tissues of the chest wall demonstrate mild inflammatory changes and edema.  No axillary or supraclavicular adenopathy.  Nodular changes of the right thyroid, nonspecific on CT.  Large hiatal hernia with essentially intra thoracic stomach. Density along the mesenteric surface of the herniated stomach most likely related to a prior surgery.  Fluid filled stomach, with no evidence of obstruction.  Multiple mediastinal lymph nodes on the left than the right. Largest are within the lowest peritracheal nodal station and the prevascular nodal stations. Largest on the left measures 12 mm in short axis dimension, largest on the right measures 12 mm in short axis dimension.  Bilateral hilar lymph nodes.  Heart size enlarged. Dense calcifications of the mitral annulus. Calcifications of the left main, left anterior descending, circumflex, right coronary arteries. No pericardial fluid/ thickening.  Trachea is patent.  Left and right mainstem bronchus patent.  Debris within the proximal bronchi of the right lower lobe.  Loculated fluid of the right pleural space with associated atelectasis of the lung. No significant enhancement is appreciated at the pleural surface. Hyperdense material at the lateral aspect of  the right pleural space, potentially pleural plaques or postoperative changes.  Trace pleural fluid on the left in the costophrenic sulcus.  Mixed ground-glass and nodular opacities the bilateral lungs. No large region of confluent airspace disease.  Caliber and contour of the thoracic aorta unremarkable tortuosity is present. Mixed atheromatous and calcified plaque of the visualized aorta. No dissection flap. No aneurysm. No periaortic fluid. Branch vessels are patent with atherosclerotic changes at the origin. Three vessel arch.  No central lobar or proximal segmental filling defects. Beyond this, the study is limited for evaluation of distal pulmonary emboli.  Unremarkable appearance of the upper abdomen.  Compression fracture of T12 with mild retropulsion of the posterior superior endplate. At this level the canal measures approximately 10 mm in diameter on the reformatted sagittal images.  Degenerative disc disease of the thoracic spine. No additional displaced fractures. Posttraumatic deformity of left-sided rib angles with no displaced rib fractures identified.  IMPRESSION: Bilateral loculated pleural fluid, moderate on the right and small on the left. Sterility is indeterminate by imaging, however, the loculated appearance suggests a complex effusion, potentially empyema. Recommend correlation with recent thoracentesis results.  Multiple mediastinal lymph nodes, likely reactive given the appearance of the pleural fluid.  Large hiatal hernia with essentially intra thoracic stomach. Evidence of prior surgical repair.  Atherosclerosis with left main and 3 vessel coronary artery disease.  Age indeterminate T12 compression fracture with minimal displacement of the superior posterior endplate. No significant canal narrowing.  No evidence of pulmonary emboli, though the study is somewhat limited.  Signed,  Yvone Neu. Loreta Ave, DO  [facility]  [facility]   Electronically  Signed   By: Gilmer Mor D.O.   On: 04/16/2015 14:56   Dg Chest Port 1 View  04/11/2015   CLINICAL DATA:  81 year old female with shortness of breath.  EXAM: PORTABLE CHEST - 1 VIEW  COMPARISON:  09/13/2013 prior radiographs  FINDINGS: Cardiomegaly noted in this low volume film.  Bibasilar opacities are again identified likely representing atelectasis/ scarring.  There is no evidence of pulmonary edema, suspicious pulmonary nodule/mass, pleural effusion, or pneumothorax. No acute bony abnormalities are identified.  IMPRESSION: Cardiomegaly with bibasilar opacities again noted -likely atelectasis/ scarring.  .   Electronically Signed   By: Harmon Pier M.D.   On: 04/07/2015 17:40   US Thoracentesis Asp Pleural Space W/img Guide  04/15/2015   CLINICAL DATA:  Right pleural effusion.  EXAM: ULTRASOUND GUIDED right THORACENTESIS  COMPARISON:  Chest radiograph of same date.  PROCEDURE: Informed consent was obtained from the patient's power of attorney.  Ultrasound was performed to localize and mark an adequate pocket of fluid in the right chest. The area was then prepped and draped in the normal sterile fashion. 1% Lidocaine was used for local anesthesia. Under ultrasound guidance a Safe-T-Centesis catheter was introduced. Thoracentesis was performed. The catheter was removed and a dressing applied.  Complications:  None immediate.  FINDINGS: A total of approximately 15 mL of serous fluid was removed. A fluid sample wassent for laboratory analysis. Only a small amount of fluid was obtained due to the extremely loculated nature of this effusion.  IMPRESSION: Successful ultrasound guided right thoracentesis yielding 15 mL of pleural fluid.   Electronically Signed   By: Lupita Raider, M.D.   On: 04/15/2015 16:54       Today   Subjective:   Data Review     CBC w Diff: Lab Results  Component Value Date   WBC 12.9* 04/16/2015   WBC 8.5 09/11/2014   HGB 12.4 04/16/2015   HGB 13.4 09/11/2014   HCT 38.4  04/16/2015   HCT 41.8 09/11/2014   PLT 198 04/16/2015   PLT 191 09/11/2014   LYMPHOPCT 18.9 09/13/2013   MONOPCT 11.8 09/13/2013   EOSPCT 0.4 09/13/2013   BASOPCT 0.3 09/13/2013   CMP: Lab Results  Component Value Date   NA 141 04/16/2015   NA 141 09/11/2014   K 4.1 04/16/2015   K 3.3* 09/11/2014   CL 111 04/16/2015   CL 102 09/11/2014   CO2 26 04/16/2015   CO2 33* 09/11/2014   BUN 16 04/16/2015   BUN 13 09/11/2014   CREATININE 0.92 04/17/2015   CREATININE 0.97 09/11/2014   PROT 6.0* 04/01/2015   PROT 6.2* 09/11/2014   ALBUMIN 3.3* 04/09/2015   ALBUMIN 3.0* 09/11/2014   BILITOT 0.4 04/01/2015   BILITOT 0.3 09/11/2014   ALKPHOS 97 04/17/2015   ALKPHOS 117* 09/11/2014   AST 18 04/12/2015   AST 12* 09/11/2014   ALT 10* 04/27/2015   ALT 13* 09/11/2014  .  Micro Results Recent Results (from the past 240 hour(s))  Blood culture (routine x 2)     Status: None   Collection Time: 04/29/2015  5:43 PM  Result Value Ref Range Status   Specimen Description BLOOD LEFT ARM  Final   Special Requests BOTTLES DRAWN AEROBIC AND ANAEROBIC  Final   Culture NO GROWTH 5 DAYS  Final   Report Status 04/15/2015 FINAL  Final  Blood culture (  routine x 2)     Status: None   Collection Time: 04/06/2015  5:55 PM  Result Value Ref Range Status   Specimen Description BLOOD LEFT ARM  Final   Special Requests   Final    BOTTLES DRAWN AEROBIC AND ANAEROBIC  AER 1CC ANA 2CC   Culture NO GROWTH 5 DAYS  Final   Report Status 04/15/2015 FINAL  Final  Urine culture     Status: None   Collection Time: 04/14/15  5:00 PM  Result Value Ref Range Status   Specimen Description URINE, CATHETERIZED  Final   Special Requests zosyn Normal  Final   Culture >=100,000 COLONIES/mL CANDIDA GLABRATA  Final   Report Status 04/18/2015 FINAL  Final  Body fluid culture     Status: None   Collection Time: 04/15/15  5:02 PM  Result Value Ref Range Status   Specimen Description PLEURAL  Final   Special Requests  NONE  Final   Gram Stain NO WBC SEEN NO ORGANISMS SEEN   Final   Culture NO GROWTH 3 DAYS  Final   Report Status 04/18/2015 FINAL  Final               Total Time in preparing paper work, data evaluation and todays exam - 35 minutes  Auburn Bilberry M.D on 2015-05-12 at 12:30 PM  [facility]   Office  [phone]

## 2015-05-02 NOTE — Progress Notes (Signed)
Pt expired this AM at 0518. Family member at bedside. [facility] [place] [facility] [facility] [employer] [facility] [place]. Pt not a potential organ donor per [facility].

## 2015-05-02 DEATH — deceased
